# Patient Record
Sex: Female | Born: 1985 | Race: White | Hispanic: No | Marital: Single | Smoking: Current every day smoker
Health system: Southern US, Community
[De-identification: ages and names within clinical notes are randomized; demographics above are authoritative.]

## PROBLEM LIST (undated history)

## (undated) ENCOUNTER — Inpatient Hospital Stay (HOSPITAL_COMMUNITY): Payer: Self-pay

## (undated) DIAGNOSIS — R87619 Unspecified abnormal cytological findings in specimens from cervix uteri: Secondary | ICD-10-CM

## (undated) DIAGNOSIS — Z22322 Carrier or suspected carrier of Methicillin resistant Staphylococcus aureus: Secondary | ICD-10-CM

## (undated) DIAGNOSIS — G971 Other reaction to spinal and lumbar puncture: Secondary | ICD-10-CM

## (undated) DIAGNOSIS — B999 Unspecified infectious disease: Secondary | ICD-10-CM

## (undated) DIAGNOSIS — J45909 Unspecified asthma, uncomplicated: Secondary | ICD-10-CM

## (undated) DIAGNOSIS — F1910 Other psychoactive substance abuse, uncomplicated: Secondary | ICD-10-CM

## (undated) DIAGNOSIS — T8859XA Other complications of anesthesia, initial encounter: Secondary | ICD-10-CM

## (undated) DIAGNOSIS — B977 Papillomavirus as the cause of diseases classified elsewhere: Secondary | ICD-10-CM

## (undated) DIAGNOSIS — B029 Zoster without complications: Secondary | ICD-10-CM

## (undated) DIAGNOSIS — T4145XA Adverse effect of unspecified anesthetic, initial encounter: Secondary | ICD-10-CM

## (undated) DIAGNOSIS — IMO0002 Reserved for concepts with insufficient information to code with codable children: Secondary | ICD-10-CM

## (undated) DIAGNOSIS — E282 Polycystic ovarian syndrome: Secondary | ICD-10-CM

## (undated) DIAGNOSIS — F329 Major depressive disorder, single episode, unspecified: Secondary | ICD-10-CM

## (undated) DIAGNOSIS — F32A Depression, unspecified: Secondary | ICD-10-CM

## (undated) HISTORY — DX: Zoster without complications: B02.9

## (undated) HISTORY — DX: Other complications of anesthesia, initial encounter: T88.59XA

## (undated) HISTORY — PX: DILATION AND CURETTAGE OF UTERUS: SHX78

## (undated) HISTORY — DX: Adverse effect of unspecified anesthetic, initial encounter: T41.45XA

## (undated) HISTORY — DX: Papillomavirus as the cause of diseases classified elsewhere: B97.7

## (undated) HISTORY — DX: Other reaction to spinal and lumbar puncture: G97.1

## (undated) HISTORY — PX: EXTERNAL EAR SURGERY: SHX627

## (undated) HISTORY — DX: Other psychoactive substance abuse, uncomplicated: F19.10

---
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
## 2006-12-05 ENCOUNTER — Emergency Department (HOSPITAL_COMMUNITY): Admission: EM | Admit: 2006-12-05 | Discharge: 2006-12-05 | Payer: Self-pay | Admitting: Emergency Medicine

## 2007-04-17 ENCOUNTER — Inpatient Hospital Stay (HOSPITAL_COMMUNITY): Admission: AD | Admit: 2007-04-17 | Discharge: 2007-04-17 | Payer: Self-pay | Admitting: Family Medicine

## 2007-05-26 ENCOUNTER — Inpatient Hospital Stay (HOSPITAL_COMMUNITY): Admission: AD | Admit: 2007-05-26 | Discharge: 2007-05-26 | Payer: Self-pay | Admitting: Family Medicine

## 2007-06-28 ENCOUNTER — Inpatient Hospital Stay (HOSPITAL_COMMUNITY): Admission: AD | Admit: 2007-06-28 | Discharge: 2007-06-29 | Payer: Self-pay | Admitting: Gynecology

## 2007-09-20 ENCOUNTER — Ambulatory Visit (HOSPITAL_COMMUNITY): Admission: RE | Admit: 2007-09-20 | Discharge: 2007-09-20 | Payer: Self-pay | Admitting: Obstetrics & Gynecology

## 2007-11-04 ENCOUNTER — Inpatient Hospital Stay (HOSPITAL_COMMUNITY): Admission: AD | Admit: 2007-11-04 | Discharge: 2007-11-04 | Payer: Self-pay | Admitting: Family Medicine

## 2007-11-25 ENCOUNTER — Inpatient Hospital Stay (HOSPITAL_COMMUNITY): Admission: AD | Admit: 2007-11-25 | Discharge: 2007-11-26 | Payer: Self-pay | Admitting: Obstetrics & Gynecology

## 2007-12-05 ENCOUNTER — Ambulatory Visit: Payer: Self-pay | Admitting: Obstetrics & Gynecology

## 2007-12-10 ENCOUNTER — Ambulatory Visit: Payer: Self-pay | Admitting: Obstetrics & Gynecology

## 2007-12-10 ENCOUNTER — Inpatient Hospital Stay (HOSPITAL_COMMUNITY): Admission: AD | Admit: 2007-12-10 | Discharge: 2007-12-13 | Payer: Self-pay | Admitting: Gynecology

## 2007-12-11 ENCOUNTER — Encounter: Payer: Self-pay | Admitting: Obstetrics & Gynecology

## 2007-12-14 ENCOUNTER — Inpatient Hospital Stay (HOSPITAL_COMMUNITY): Admission: AD | Admit: 2007-12-14 | Discharge: 2007-12-14 | Payer: Self-pay | Admitting: Obstetrics & Gynecology

## 2008-04-12 HISTORY — PX: INCISION AND DRAINAGE OF WOUND: SHX1803

## 2008-12-15 ENCOUNTER — Emergency Department (HOSPITAL_COMMUNITY): Admission: EM | Admit: 2008-12-15 | Discharge: 2008-12-15 | Payer: Self-pay | Admitting: Emergency Medicine

## 2009-03-16 ENCOUNTER — Emergency Department (HOSPITAL_COMMUNITY): Admission: EM | Admit: 2009-03-16 | Discharge: 2009-03-16 | Payer: Self-pay | Admitting: Family Medicine

## 2009-03-18 ENCOUNTER — Emergency Department (HOSPITAL_COMMUNITY): Admission: EM | Admit: 2009-03-18 | Discharge: 2009-03-18 | Payer: Self-pay | Admitting: Family Medicine

## 2009-03-19 ENCOUNTER — Encounter (INDEPENDENT_AMBULATORY_CARE_PROVIDER_SITE_OTHER): Payer: Self-pay

## 2009-03-19 ENCOUNTER — Inpatient Hospital Stay (HOSPITAL_COMMUNITY): Admission: EM | Admit: 2009-03-19 | Discharge: 2009-03-23 | Payer: Self-pay | Admitting: Emergency Medicine

## 2010-07-14 LAB — POCT CARDIAC MARKERS
CKMB, poc: <1 ng/mL — ABNORMAL LOW (ref 1.0–8.0)
Myoglobin, poc: 62.2 ng/mL (ref 12–200)

## 2010-07-14 LAB — CBC
HCT: 34.4 % — ABNORMAL LOW (ref 36.0–46.0)
Hemoglobin: 11.6 g/dL — ABNORMAL LOW (ref 12.0–15.0)
Hemoglobin: 11.9 g/dL — ABNORMAL LOW (ref 12.0–15.0)
MCHC: 34.5 g/dL (ref 30.0–36.0)
MCHC: 35.3 g/dL (ref 30.0–36.0)
MCV: 86.2 fL (ref 78.0–100.0)
MCV: 87.2 fL (ref 78.0–100.0)
Platelets: 213 10*3/uL (ref 150–400)
RBC: 3.64 MIL/uL — ABNORMAL LOW (ref 3.87–5.11)
RBC: 3.81 MIL/uL — ABNORMAL LOW (ref 3.87–5.11)
RBC: 3.98 MIL/uL (ref 3.87–5.11)
RDW: 12.3 % (ref 11.5–15.5)
RDW: 12.5 % (ref 11.5–15.5)
RDW: 12.8 % (ref 11.5–15.5)
WBC: 6.3 10*3/uL (ref 4.0–10.5)

## 2010-07-14 LAB — BASIC METABOLIC PANEL
Calcium: 8.9 mg/dL (ref 8.4–10.5)
GFR calc Af Amer: >60 mL/min (ref >60)
GFR calc non Af Amer: >60 mL/min (ref >60)
Glucose, Bld: 99 mg/dL (ref 70–99)
Potassium: 3.4 mEq/L — ABNORMAL LOW (ref 3.5–5.1)
Sodium: 137 mEq/L (ref 135–145)

## 2010-07-14 LAB — CULTURE, ROUTINE-ABSCESS

## 2010-07-14 LAB — POCT I-STAT, CHEM 8
Glucose, Bld: 112 mg/dL — ABNORMAL HIGH (ref 70–99)
HCT: 40 % (ref 36.0–46.0)
Hemoglobin: 13.6 g/dL (ref 12.0–15.0)
Potassium: 3.4 mEq/L — ABNORMAL LOW (ref 3.5–5.1)
Sodium: 135 mEq/L (ref 135–145)
TCO2: 23 mmol/L (ref 0–100)

## 2010-07-14 LAB — ANAEROBIC CULTURE

## 2010-07-14 LAB — DIFFERENTIAL
Basophils Absolute: 0 10*3/uL (ref 0.0–0.1)
Basophils Relative: 1 % (ref 0–1)
Eosinophils Absolute: 0.2 10*3/uL (ref 0.0–0.7)
Monocytes Relative: 7 % (ref 3–12)
Neutro Abs: 5.2 10*3/uL (ref 1.7–7.7)
Neutrophils Relative %: 63 % (ref 43–77)

## 2010-08-25 NOTE — Op Note (Signed)
[name], Campbell               ACCOUNT NO.:  [id number]   MEDICAL RECORD NO.:  [id number]          PATIENT TYPE:  INP   LOCATION:  9102                          FACILITY:  [facility]   PHYSICIAN:  Norton Blizzard, MD    DATE OF BIRTH:  06-24-1985   DATE OF PROCEDURE:  12/11/2007  DATE OF DISCHARGE:                               OPERATIVE REPORT   PREOPERATIVE DIAGNOSES:  1. Term intrauterine pregnancy.  2. Nonreassuring fetal heart tracing remote from delivery.   POSTOPERATIVE DIAGNOSES:  1. Term intrauterine pregnancy.  2. Nonreassuring fetal heart tracing remote from delivery.   PROCEDURE:  Primary low transverse cesarean section.   SURGEON:  Norton Blizzard, MD   ASSISTANT:  Odie Sera, DO.   ANESTHESIA:  Epidural and local.   INDICATIONS FOR PROCEDURE:  [name] is a 25 year old gravida  2, para 0-0-1-0 who was admitted for induction of labor at 41-3/[redacted] weeks  gestational age.  She was induced with Cytotec and Pitocin; however,  upon increasing the dose of Pitocin, the baby had repetitive severe  variable decelerations.  Because of the nonreassuring fetal heart  tracing remote from delivery, the decision was made to proceed with  cesarean section.  The patient was counseled on the benefits and risks  of cesarean section, and agreed to proceed with surgery.   FINDINGS:  1. Clear amniotic fluid.  2. Viable female infant.  3. Grossly normal uterus, fallopian tubes, and ovaries.   SPECIMENS:  Placenta.   DISPOSITION:  To labor and delivery.   IV FLUIDS:  In 800 mL.   ESTIMATED BLOOD LOSS:  700 mL.   URINE OUTPUT:  100 mL.   COMPLICATIONS:  None immediate.   PROCEDURE DETAILS:  The patient was taken to the operating room and a  time-out was conducted.  The patient was prepped and draped in the usual  sterile manner, and placed in the left dorsal supine position.  Appropriate anesthesia with epidural was confirmed.  The area of  incision was infiltrated  with 20 mL of 1% lidocaine.  A transverse  Pfannenstiel incision was made through the skin with a scalpel, and  continued down until the fascia was incised in the midline.  The fascia  was bluntly dissected off the underlying rectus muscles in the usual  manner.  The fascial incision was extended bilaterally using scissors.  The rectal muscles were spread and the peritoneum was entered bluntly.  A bladder blade was placed and a transverse incision was made in the  lower uterine segment with a scalpel.  The last layers of the myometrium  were penetrated bluntly with the finger.  The uterine incision was  extended bluntly.  Clear amniotic fluid was noted.  The head was grasped  and elevated to the pelvis.  Fundal pressure was applied and the head  delivered easily.  The mouth was bulb suctioned and there was a  spontaneous cry.  The position of the baby was occiput posterior.  The  shoulders and rest of the corpus were delivery without complications.  The cord was clamped and cut.  There were no gross abnormalities of the  infant.  The infant was handed to the NICU team with a spontaneous cry.  The placenta was delivered manually, intact with 3-vessel cord.  The  uterus was exteriorized, and grossly normal tubes and ovaries were  noted.  The uterus was cleared of debris and clots with a dry lap.  The  uterine incision was closed with 0 Monocryl in a running interlocking  fashion.  Hemostasis was confirmed.  The uterus was returned to the  abdomen and hemostasis once again was confirmed.  The rectus muscles  were loosely approximated using 0 Vicryl.  The fascia was closed in the  usual manner with 0 Vicryl.  No fascial defects were noted.  Hemostasis  was also noted.  Because the subcutaneous fat had a depth of over 1  inch, the subcutaneous fat and Scarpa fascia were also approximated  using 2-0 plain gut.  The skin was closed with staples.  The patient  tolerated the procedure well with no  complications, and she was sent to  the PACU in good condition.      Odie Sera, DO  Electronically Signed     ______________________________  Norton Blizzard, MD    MC/MEDQ  D:  12/11/2007  T:  12/12/2007  Job:  [id number]
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
## 2010-08-28 NOTE — Op Note (Signed)
[facility]  Patient:    Taylor Campbell, Taylor Campbell Visit Number: [id number] MRN: [id number]          Service Type: DSU Location: [facility] Attending Physician:  Fernande Boyden Proc. Date: 02/13/01 Admit Date:  02/13/2001                             Operative Report  PREOPERATIVE DIAGNOSIS:  Chronic suppurative otitis media/cholesteotoma, left.  POSTOPERATIVE DIAGNOSIS:  Chronic suppurative otitis media/cholesteotoma, left.  PROCEDURE:  Left tympanoplasty with osiculoplasty, left T-tube placement.  SURGEON:  Gloris Manchester. Lazarus Salines, M.D.  ANESTHESIA:  General orotracheal.  ESTIMATED BLOOD LOSS:  Minimal.  COMPLICATIONS:  None.  FINDINGS:  An indwelling anterior Paparella type tube.  Moderate crusting in the medial canal on the left side.  A significant posterior superior pars tensa left tympanic retraction up into the facial recess area.  On further exploration, including erosion of the long process of the incus and erosion of the stapes suprastructure with cholesteotoma back into the facial recess.  No cholesteotoma in the attic.  Mobile maleus and incus.  Somewhat foreshortened long process of the maleus, but no attachment to the promentory.  A very prominent jugular bulb in the posterior inferior mesotynpanum.  No signs of active infection.  DESCRIPTION OF PROCEDURE:  With the patient in a comfortable supine position, general orotracheal anesthesia was induced without difficulty.  At an appropriate level, the table was turned 90 degrees away from anesthesia and the patient placed in a slight reversed Trendelenburg.  The head was rotated toward the right for access to the left ear region.  A minimal hair shave was performed.  1% Xylocaine with 1:100,000 epinephrine, 10 cc total was infiltrated into the postauricular incision site into the vascular strip from the postauricular approach, and into the temporalis fascia harvest site. A sterile  preparation and draping of the left ear was accomplished.  Microscope and speculum were brought into the field and the ear canal was inspected and cleaned with the findings as described above.  The four quadrant injections of 2% Xylocaine with 1:50,000 epinephrine, 2 cc total were infiltrated in the standard fashion.  Several minutes were allowed for these to take effect.  A postauricular incision was made in the standard fashion using a Shaw scalpel and carried down through the skin, subcutaneous tissues, and down to the periosteum of the mastoid and the area overlying the temporalis fascia.  The lateral surface of the temporalis fascia was cleared.  An inferior incision was made and the medial surface was cleared and a 1.5 x 2 piece of temporalis fascia was harvested, cleaned, pressed, and placed to dry.  Hemostasis was spontaneous.  The periosteum along the linea temporalis and down along the mastoid cortex was incised and elevated forward to a short degree in preparation for a postauricular approach.  Attention was returned to the ear canal.  The ear canal was further cleaned with the findings as described above.  6 and 12 oclock incisions were made from the annulus outward into the cartilaginous canal and connected not quite at the bony cartilaginous junction on the posterior aspect of the canal with erosion round knife.  The medial flap was elevated to some distance down toward the annulus, but this elevation was not completed.  Attention was returned to the postauricular region and using the [facility] elevator, the periosteum was elevated forward toward the ear canal.  Upon reaching the  canal proper, a sharp Freer elevator was used to further elevate the canal down into the bony canal.  The incisions were identified and the vascular strip was retracted forward.  Weidlaner and Perkins retractors were used to widen the entire field.  From this vantage point, good access to the  posterior middle ear was accomplished.  The flap was elevated downward and the middle ear space was entered inferiorly.  The prominent jugular bulb as identified above was identified and the middle ear space was lateral to this and was containing a small amount of thin fluid, but no mucosal adhesions in this vicinity.  The vascular strip was incised away from the retraction pocket and the retraction pocket was incised in all its circumference from the tympanic membrane.  This flap was laid forward.  Working around all aspects of the retraction pocket, it was elevated posteriorly from the anterior aspect and underneath the annulus in the posterior aspect.  A large cutting bur was used to lower the spine of Hendley and enlarge the external meatus slightly.  A smaller cutting bur was used to perform an atticotomy in stages and the final thin membrane was cleared with a curet.  The cortotympany nerve had been identified, but was sacrificed in the process of trying to widen the atticotomy to the point to allow complete access around the retraction pocket.  Working through this retraction pocket, the sac was gently elevated from the facial recess using the Rosen round knife and the sinus tympany excavators.  The long process of the incus was identified and the material was dissected free.  The remainder of the cortotympany nerve was identified and retracted free from the retraction pocket.  Working more deeply, the stapes capisculum was identified along with the trapezius tendon, but it was free floating and does appear to be any stapes crure.  The dissection was carefully brought back from the promentory up from the inferior facial recess and finally remained pedicle in the region of the stapes footplate.  The facial nerve was noted to be bone covered in the entire surgical field.  The dissection was carried down carefully removing all material from the facial recess and finally the sac  was elevated upward from the stapes footplate which was identified and noted to be mobile.  No evidence of suprastructure was identified nor was there any  evidence of leaking.  The sac was observed to be intact and sufficient integrity.  The dissection was easily carried out to bring the entire sac forward and deliver it without violation.  The sac was sent for a specimen.  At this point, the incus was observed to be far away from any connection to the footplate and it was disarticulated and saved for later use.  The tensor tympany tendon was lysed to allow the long process of the maleus to lateralize slightly and come a little bit more posterior.  The Paparella tube was removed and replaced with a modified T-tube without difficulty.  A small crescent of 0.020 reinforced Silastic was fashioned and placed up into the eustachian tube and back into the posterior inferior mesotympanum to prevent adhesions over the jugular bulb following reconstruction.  Upon removing the incus, the attic was explored blindly and no evidence of cholesteotoma was noted.  The incus was fashioned with the long process going down toward the footplate and a notch was made for the maleus handle.  This was situated and observed to be of adequate depth and stability.  It was  supported in the oval window niche with cortisporin-impregnated Gelfoam.  At this point, the previously harvested fascia was trimmed to size.  It was placed beneath the tympanic membrane remnant and situated in between the incus inner position and the maleus long handle.  It was draped up the posterior canal wall.  The vascular strip was laid across the graft and situated so the annulus could heal back into is normal position.  Cortisporin-impregnated Gelfoam pledgets were placed across the entire surface of the tympanic membrane and up the canal to support the graft.  Attention was returned to the postauricular wound.  The periosteum  was reapproximated with 3-0 chromic as was the skin and subcutaneous tissues. Benzoin was applied and Steri-Strips applied to close the postauricular wound.  Returning to the canal, the vascular strip was laid back down across the temporalis fascia and additional Gelfoam packing was used to support this. Finally a cottonball was wedged into the external meatus and another cottonball placed into the conchal bowl.  This completed the surgery.  A standard fluff and cling mastoid dressing was applied.  The patient was returned to anesthesia, awakened, extubated, and transferred to the recovery room in stable condition.  COMMENTS:  A 25 year old white female with a several-year history of problems with her ear failing to respond to prolonged ventilation was the indication for todays procedure.  Anticipated routine postoperative recovery with attention to analgesics, antibiosis, water precautions.  Will remove the mastoid dressing in two days, will remove the ear canal cottonballs in one week and begin cortisporin drops to dissolve the Gelfoam.  Given low anticipated risks of postanesthetic and postsurgical complications, I feel an outpatient venu is appropriate. Attending Physician:  Fernande Boyden DD:  02/13/01 TD:  02/14/01 Job: [id number]

## 2010-08-28 NOTE — Discharge Summary (Signed)
NAMEJUDAH, Taylor Campbell               ACCOUNT NO.:  [id number]   MEDICAL RECORD NO.:  [id number]          PATIENT TYPE:  INP   LOCATION:  9102                          FACILITY:  [facility]   PHYSICIAN:  Ginger Carne, MD  DATE OF BIRTH:  10/07/1985   DATE OF ADMISSION:  12/10/2007  DATE OF DISCHARGE:  12/13/2007                               DISCHARGE SUMMARY   REASON FOR ADMISSION:  Induction of labor.   PROCEDURES:  1. Ultrasound.  2. Nonstress test.   PROCEDURES INTRAPARTUM:  Cesarean section, low transverse.   PROCEDURE POSTPARTUM:  None.   COMPLICATIONS:  None.   DISCHARGE DIAGNOSES:  1. Term pregnancy delivered.  2. Failed induction.   This is a 25 year old G2, P 0-0-1-0 female presenting for induction of  labor at 41.4 weeks' gestational age due to nonreassuring fetal heart  tracing.  Low transverse C-section was performed due to late  decelerations.  A viable female infant was delivered in the OR with  Apgars 9 and 9.  The patient had no complications following surgery.  The patient was discharged to home on postoperative day 2 in stable  condition.  Staple removal on postoperative day 5-7.   PERTINENT LABORATORY DATA:  Blood type O positive, antibody negative,  GBS negative, and rubella immune.   Hemoglobin 10.8 on postoperative day 1.   DISCHARGE MEDICATIONS:  The patient's discharge medications are as  follows:  1. Percocet 1-2 tablets p.o. q.4-6 h. p.r.n. pain.  2. Ibuprofen 600 mg p.o. q.6 h. p.r.n. pain.  3. Colace 100 mg b.i.d. p.r.n. constipation.   The patient will be using Depo-Provera, outpatient setting for birth  control.  The patient is bottle feeding.   The patient was discharged to home in stable condition.   INSTRUCTIONS:  1. Pelvic rest x6 weeks.  2. Follow up at [facility] in 6 weeks.  3. Baby Love nurse to home on postoperative day 5-7 to remove staples.      Bobby Rumpf, MD  Electronically Signed     ______________________________  Ginger Carne, MD    KC/MEDQ  D:  01/19/2008  T:  01/20/2008  Job:  [id number]
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
## 2010-12-06 IMAGING — CR DG ANKLE COMPLETE 3+V*L*
3 series · 3 of 3 positions shown · non-contrast
Comparison: None

CLINICAL DATA: Left ankle injury.

LEFT ANKLE COMPLETE - 3+ VIEW

[view not recorded (1 of 3)]
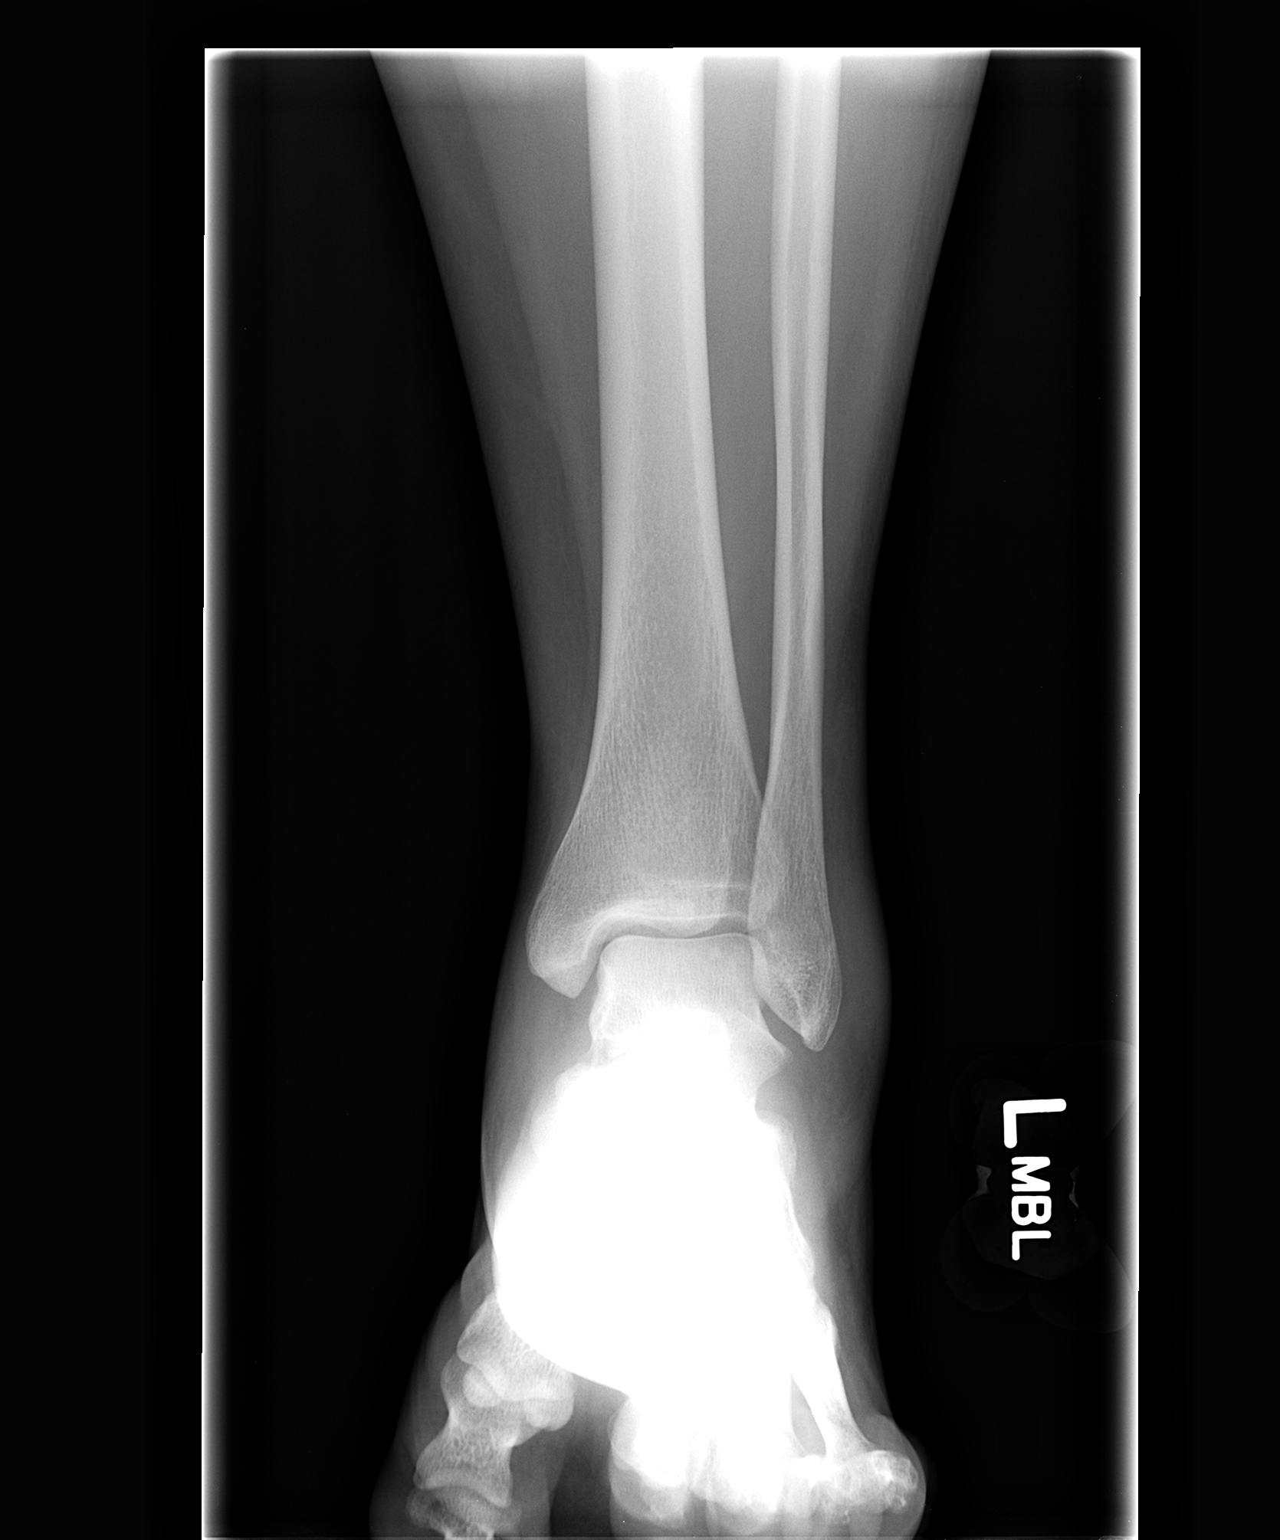

[view not recorded (2 of 3)]
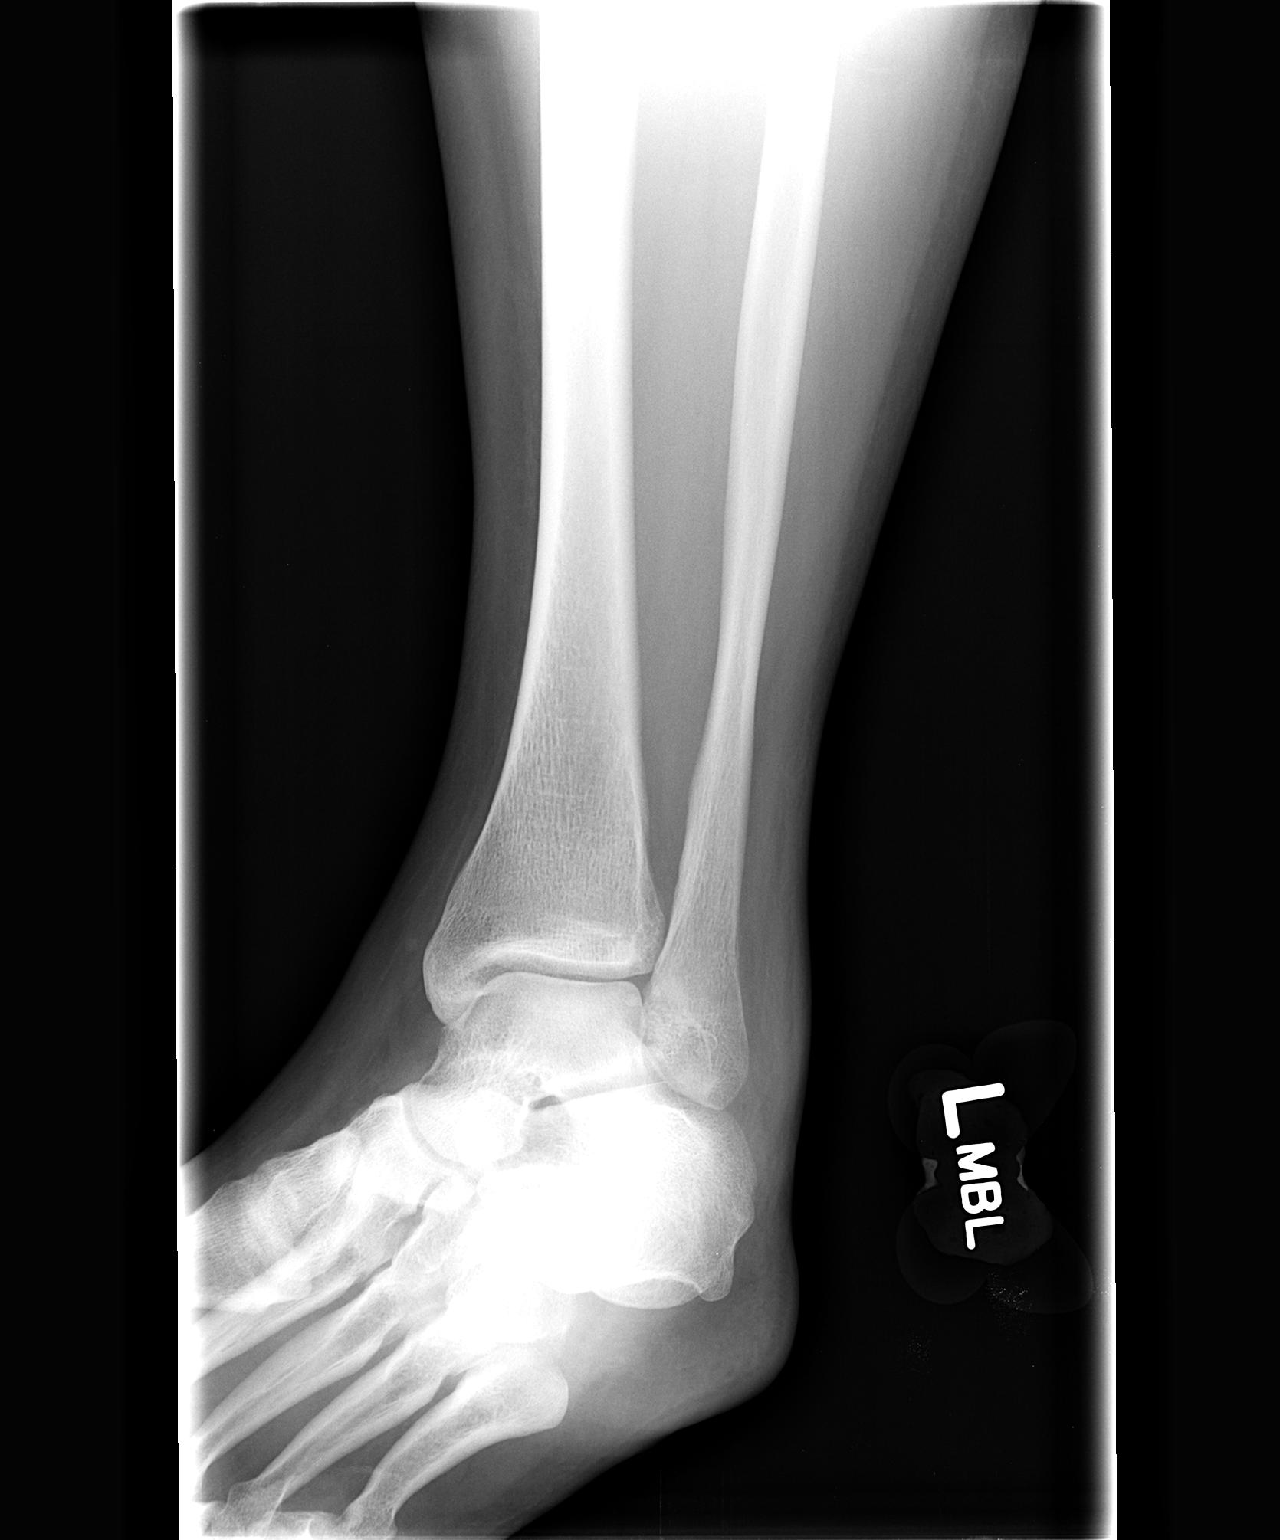

[view not recorded (3 of 3)]
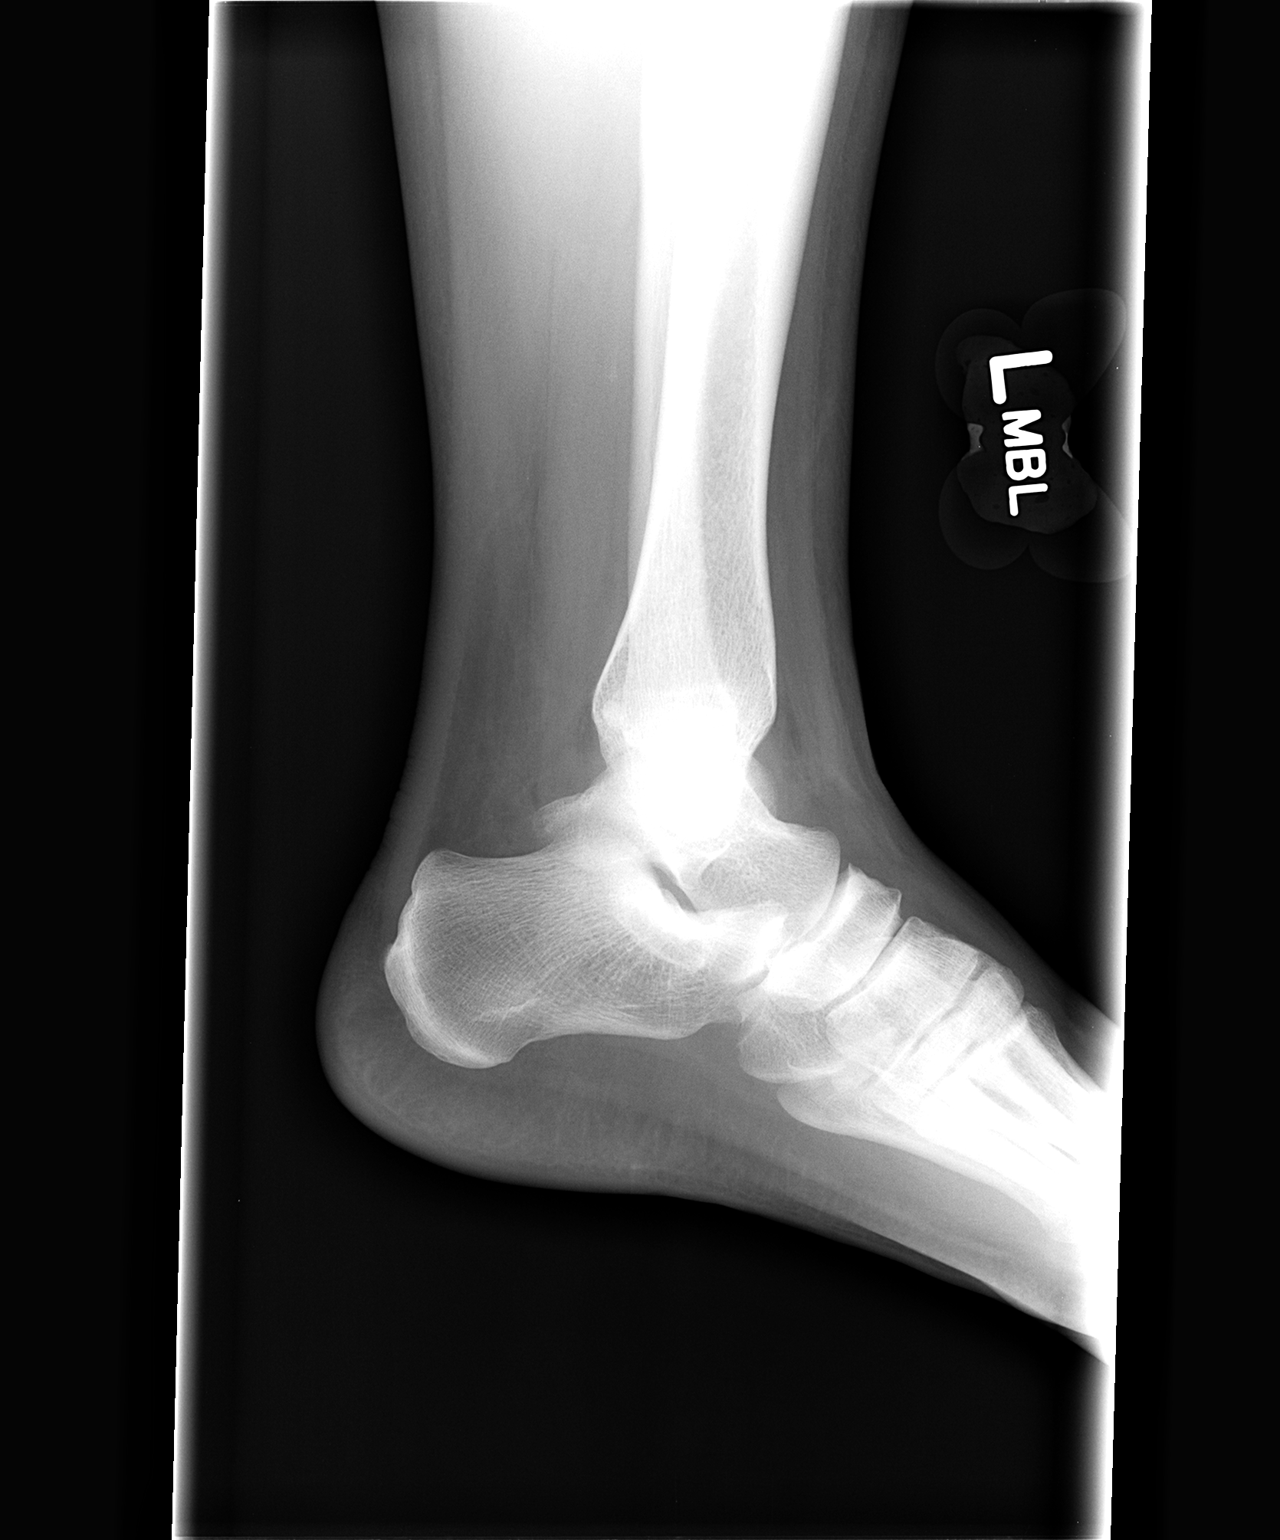

[3 of 3 positions shown; findings below may reference images not displayed]

FINDINGS: The ankle mortise is maintained.  No fractures are seen.
No osteochondral abnormalities.  There is marked soft tissue
swelling.  No definite joint effusion.
IMPRESSION: No acute bony findings.

## 2010-12-31 LAB — CBC
HCT: 41.2
MCHC: 34.8
MCV: 90.2
RBC: 4.57
WBC: 9.6

## 2010-12-31 LAB — WET PREP, GENITAL
Clue Cells Wet Prep HPF POC: NONE SEEN
Yeast Wet Prep HPF POC: NONE SEEN

## 2010-12-31 LAB — URINALYSIS, ROUTINE W REFLEX MICROSCOPIC
Glucose, UA: NEGATIVE
Ketones, ur: NEGATIVE
Nitrite: NEGATIVE
pH: 8

## 2010-12-31 LAB — POCT PREGNANCY, URINE: Operator id: 239321

## 2011-01-01 LAB — GC/CHLAMYDIA PROBE AMP, GENITAL: Chlamydia, DNA Probe: NEGATIVE

## 2011-01-01 LAB — WET PREP, GENITAL
Clue Cells Wet Prep HPF POC: NONE SEEN
WBC, Wet Prep HPF POC: NONE SEEN

## 2011-01-04 LAB — CBC
Platelets: 142 — ABNORMAL LOW
RDW: 13.1
WBC: 8.4

## 2011-01-04 LAB — URINALYSIS, ROUTINE W REFLEX MICROSCOPIC
Ketones, ur: NEGATIVE
Nitrite: NEGATIVE
Protein, ur: NEGATIVE
Urobilinogen, UA: 0.2

## 2011-01-04 LAB — DIFFERENTIAL
Basophils Relative: 0
Eosinophils Absolute: 0.1
Eosinophils Relative: 2
Monocytes Relative: 10
Neutrophils Relative %: 66

## 2011-01-04 LAB — GC/CHLAMYDIA PROBE AMP, GENITAL: Chlamydia, DNA Probe: NEGATIVE

## 2011-01-04 LAB — WET PREP, GENITAL

## 2011-01-13 LAB — CBC
HCT: 30.9 — ABNORMAL LOW
Hemoglobin: 10.8 — ABNORMAL LOW
RBC: 3.31 — ABNORMAL LOW
RDW: 12.8
WBC: 10

## 2012-01-11 ENCOUNTER — Encounter (HOSPITAL_COMMUNITY): Payer: Self-pay | Admitting: Emergency Medicine

## 2012-01-11 ENCOUNTER — Emergency Department (INDEPENDENT_AMBULATORY_CARE_PROVIDER_SITE_OTHER)
Admission: EM | Admit: 2012-01-11 | Discharge: 2012-01-11 | Disposition: A | Discharge status: Home / Self Care | Payer: Self-pay | Source: Home / Self Care | Attending: Emergency Medicine | Admitting: Emergency Medicine

## 2012-01-11 DIAGNOSIS — M25569 Pain in unspecified knee: Secondary | ICD-10-CM

## 2012-01-11 DIAGNOSIS — M25561 Pain in right knee: Secondary | ICD-10-CM

## 2012-01-11 DIAGNOSIS — L299 Pruritus, unspecified: Secondary | ICD-10-CM

## 2012-01-11 MED ORDER — MELOXICAM 7.5 MG PO TABS: 7.5000 mg | ORAL_TABLET | Freq: Every day | ORAL | Status: DC

## 2012-01-11 MED ORDER — PERMETHRIN 5 % EX CREA: TOPICAL_CREAM | CUTANEOUS | Status: DC

## 2012-01-11 NOTE — ED Notes (Signed)
Itching for 4 weeks.  Generalized itching.  Areas that itch are on trunk and extremities.  Initially started on trunk, under breasts.  Patient reports moving to a different home during the past 4 weeks.  Patient also c/o knee pain.  Reports intermittent swelling and pain in right knee.  Knee issues for 2 years.

## 2012-01-11 NOTE — ED Provider Notes (Signed)
History     CSN: [id number]  Arrival date & time 01/11/12  1503   None     Chief Complaint  Patient presents with  . Pruritis    (Consider location/radiation/quality/duration/timing/severity/associated sxs/prior treatment) The history is provided by the patient.  This patient complains of a pruritic rash.  Location: bilateral forearms, breast, bilateral groin, hands  Onset: 4 wk ago   Course: unchanged Self-treated with: benadryl           Improvement with treatment: no  History Itching: yes  Tenderness: no  New medications/antibiotics: no  Pet exposure: no  Recent travel or tropical exposure: no  New soaps, shampoos, detergent, clothing: no Tick/insect exposure: reports bed bugs one month ago  Red Flags Feeling ill: no Fever:no Facial/tongue swelling/difficulty breathing:  no  Diabetic or immunocompromised: no   Additionally complains of right knee pain, recurrent for the past 2 months.  No known injury.  Symptoms have been intermittent, noticed to be worse with exercise, or prolonged walking.  Denies prior history of related problems in the past.  Has not taken medication at home for pain.  History reviewed. No pertinent past medical history.  History reviewed. No pertinent past surgical history.  No family history on file.  History  Substance Use Topics  . Smoking status: Current Every Day Smoker  . Smokeless tobacco: Not on file  . Alcohol Use: Yes    OB History    Grav Para Term Preterm Abortions TAB SAB Ect Mult Living                  Review of Systems  Constitutional: Negative.   Respiratory: Negative.   Cardiovascular: Negative.   Musculoskeletal: Positive for joint swelling and arthralgias. Negative for myalgias, back pain and gait problem.  Skin: Positive for rash. Negative for color change, pallor and wound.    Allergies  Review of patient's allergies indicates no known allergies.  Home Medications   Current Outpatient Rx  Name  Route Sig Dispense Refill  . DIPHENHYDRAMINE HCL 25 MG PO TABS Oral Take 25 mg by mouth every 6 (six) hours as needed.    . MELOXICAM 7.5 MG PO TABS Oral Take 1 tablet (7.5 mg total) by mouth daily. 30 tablet 1  . PERMETHRIN 5 % EX CREA  Apply to affected area once 60 g 0    BP 124/88  Pulse 88  Temp 98.4 F (36.9 C) (Oral)  Resp 18  SpO2 99%  LMP 12/12/2011  Physical Exam  Nursing note and vitals reviewed. Constitutional: She is oriented to person, place, and time. Vital signs are normal. She appears well-developed and well-nourished. She is active and cooperative.  HENT:  Head: Normocephalic.  Mouth/Throat: Uvula is midline, oropharynx is clear and moist and mucous membranes are normal.  Eyes: Conjunctivae normal are normal. Pupils are equal, round, and reactive to light. No scleral icterus.  Neck: Trachea normal, normal range of motion and full passive range of motion without pain. Neck supple. No spinous process tenderness and no muscular tenderness present. No thyromegaly present.  Cardiovascular: Normal rate, regular rhythm, normal heart sounds, intact distal pulses and normal pulses.   Pulmonary/Chest: Effort normal and breath sounds normal.  Neurological: She is alert and oriented to person, place, and time. No cranial nerve deficit or sensory deficit.  Skin: Skin is warm and dry. Rash noted. Rash is papular.       Diffuse fine papular rash, concentrated  Psychiatric: She has a normal mood  and affect. Her speech is normal and behavior is normal. Judgment and thought content normal. Cognition and memory are normal.    ED Course  Procedures (including critical care time)  Labs Reviewed - No data to display No results found.   1. Generalized pruritus   2. Right knee pain       MDM  Cool showers; avoid heat, sunlight and anything that makes condition worse. Zyrtec for at least seven days. Elimite-follow directions.  RTC if symptoms do not improve or begin to have  problems swallowing, breathing or significant change in condition.        Johnsie Kindred, NP 01/11/12 1706

## 2012-01-12 NOTE — ED Provider Notes (Signed)
Medical screening examination/treatment/procedure(s) were performed by non-physician practitioner and as supervising physician I was immediately available for consultation/collaboration.  Leslee Home, M.D.   Reuben Likes, MD 01/12/12 [phone]

## 2012-04-27 ENCOUNTER — Encounter (HOSPITAL_COMMUNITY): Payer: Self-pay | Admitting: *Deleted

## 2012-04-27 ENCOUNTER — Inpatient Hospital Stay (HOSPITAL_COMMUNITY)
Admission: AD | Admit: 2012-04-27 | Discharge: 2012-04-27 | Disposition: A | Discharge status: Home / Self Care | Payer: Self-pay | Source: Ambulatory Visit | Attending: Obstetrics and Gynecology | Admitting: Obstetrics and Gynecology

## 2012-04-27 DIAGNOSIS — R1032 Left lower quadrant pain: Secondary | ICD-10-CM

## 2012-04-27 DIAGNOSIS — N949 Unspecified condition associated with female genital organs and menstrual cycle: Secondary | ICD-10-CM

## 2012-04-27 DIAGNOSIS — Z3202 Encounter for pregnancy test, result negative: Secondary | ICD-10-CM

## 2012-04-27 HISTORY — DX: Unspecified asthma, uncomplicated: J45.909

## 2012-04-27 HISTORY — DX: Carrier or suspected carrier of methicillin resistant Staphylococcus aureus: Z22.322

## 2012-04-27 LAB — HCG, QUANTITATIVE, PREGNANCY: hCG, Beta Chain, Quant, S: <1 m[IU]/mL (ref <5)

## 2012-04-27 LAB — URINALYSIS, ROUTINE W REFLEX MICROSCOPIC
Bilirubin Urine: NEGATIVE
Glucose, UA: NEGATIVE mg/dL
Hgb urine dipstick: NEGATIVE
Specific Gravity, Urine: 1.015 (ref 1.005–1.030)
pH: 8 (ref 5.0–8.0)

## 2012-04-27 LAB — POCT PREGNANCY, URINE: Preg Test, Ur: NEGATIVE

## 2012-04-27 NOTE — MAU Provider Note (Signed)
History     CSN: [id number]  Arrival date and time: 04/27/12 1518   First Provider Initiated Contact with Patient 04/27/12 1611      Chief Complaint  Patient presents with  . Pelvic Pain   HPI 27 y.o. G2P1011 presents with 2 weeks of LLQ and hypogastric pain described as a pressure feeling, and 2 weeks of morning nausea with 3 days of emesis. PT has a history of a miscarriage with a D&C at the age of 76.  LMP started on Dec 13th. HPT was done last Sunday and Tuesday, both with positive results.  Pt is sexually active. Denies dysuria, vaginal bleeding, cramping, constipation or change in bowel habits.  Pt does endorse urinary frequency with a white vaginal discharge. OB History    Grav Para Term Preterm Abortions TAB SAB Ect Mult Living   2 1 1  1  1   1       Past Medical History  Diagnosis Date  . Asthma   . MRSA (methicillin resistant staph aureus) culture positive     Past Surgical History  Procedure Date  . Cesarean section   . External ear surgery     History reviewed. No pertinent family history.  History  Substance Use Topics  . Smoking status: Current Every Day Smoker -- 0.5 packs/day    Types: Cigarettes  . Smokeless tobacco: Not on file  . Alcohol Use: No    Allergies: No Known Allergies  Prescriptions prior to admission  Medication Sig Dispense Refill  . [DISCONTINUED] diphenhydrAMINE (BENADRYL) 25 MG tablet Take 25 mg by mouth every 6 (six) hours as needed.      . [DISCONTINUED] meloxicam (MOBIC) 7.5 MG tablet Take 1 tablet (7.5 mg total) by mouth daily.  30 tablet  1  . [DISCONTINUED] permethrin (ELIMITE) 5 % cream Apply to affected area once  60 g  0    Review of Systems  Constitutional: Negative for fever, chills and malaise/fatigue.  Gastrointestinal: Positive for nausea, vomiting and abdominal pain. Negative for heartburn, diarrhea, constipation and blood in stool.  Genitourinary: Positive for frequency. Negative for dysuria, urgency and flank  pain.  Skin: Negative for itching and rash.  Neurological: Negative for dizziness, weakness and headaches.   Physical Exam   Blood pressure 119/67, pulse 102, temperature 97.6 F (36.4 C), temperature source Oral, resp. rate 16, last menstrual period 03/24/2012.  Physical Exam  Constitutional: She is oriented to person, place, and time. She appears well-developed and well-nourished. No distress.  Eyes: Conjunctivae normal and EOM are normal. Pupils are equal, round, and reactive to light.  Neck: Normal range of motion. Neck supple.  Cardiovascular: Normal rate, regular rhythm, normal heart sounds and intact distal pulses.   Respiratory: Effort normal and breath sounds normal.  GI: Soft. Bowel sounds are normal. She exhibits no distension. There is tenderness.  Neurological: She is alert and oriented to person, place, and time.  Skin: Skin is warm and dry.  Psychiatric: She has a normal mood and affect.    MAU Course  Procedures: NA Assumed care of pt from [facility], PA-S at 1700. Pt rates LLQ pain 2/10 w/out pain meds, but states that at times it worsen to 5 or 6/10. No N/V now. Concerned because this is not normal for her. Will order outpt pelvic US.  Quant <1. Pt very relieved.  Results for orders placed during the hospital encounter of 04/27/12 (from the past 48 hour(s))  URINALYSIS, ROUTINE W REFLEX MICROSCOPIC  Status: Abnormal   Collection Time   04/27/12  3:25 PM      Component Value Range Comment   Color, Urine YELLOW  YELLOW    APPearance CLOUDY (*) CLEAR    Specific Gravity, Urine 1.015  1.005 - 1.030    pH 8.0  5.0 - 8.0    Glucose, UA NEGATIVE  NEGATIVE mg/dL    Hgb urine dipstick NEGATIVE  NEGATIVE    Bilirubin Urine NEGATIVE  NEGATIVE    Ketones, ur NEGATIVE  NEGATIVE mg/dL    Protein, ur NEGATIVE  NEGATIVE mg/dL    Urobilinogen, UA 1.0  0.0 - 1.0 mg/dL    Nitrite NEGATIVE  NEGATIVE    Leukocytes, UA NEGATIVE  NEGATIVE MICROSCOPIC NOT DONE ON URINES  WITH NEGATIVE PROTEIN, BLOOD, LEUKOCYTES, NITRITE, OR GLUCOSE <1000 mg/dL.  POCT PREGNANCY, URINE     Status: Normal   Collection Time   04/27/12  3:42 PM      Component Value Range Comment   Preg Test, Ur NEGATIVE  NEGATIVE   HCG, QUANTITATIVE, PREGNANCY     Status: Normal   Collection Time   04/27/12  4:28 PM      Component Value Range Comment   hCG, Beta Chain, Quant, S <1  <5 mIU/mL    ASSESSMENT: 1. LLQ abdominal pain   2.  Pregnancy test negative  PLAN: D/C home. OP Pelvic US ordered. Follow-up Information    Follow up with [facility]. In 3 weeks.   Contact information:   [address] [place] [address] [phone]      Follow up with [facility]. (As needed if symptoms worsen)    Contact information:   [address] [id number] mc [place] [address] [phone]           Medication List     As of 04/28/2012  8:52 AM    STOP taking these medications         diphenhydrAMINE 25 MG tablet   Commonly known as: BENADRYL      meloxicam 7.5 MG tablet   Commonly known as: MOBIC      permethrin 5 % cream   Commonly known as: Gwen Her, Indy Prestwood 04/27/2012, 6:53 PM

## 2012-04-27 NOTE — MAU Note (Signed)
Positive HPT on Sunday, pelvic pressure for 2 weeks, also pelvic pain @ times.  Denies bleeding.

## 2012-04-30 LAB — GC/CHLAMYDIA PROBE AMP
CT Probe RNA: POSITIVE — AB
GC Probe RNA: NEGATIVE

## 2012-05-01 NOTE — MAU Provider Note (Signed)
Attestation of Attending Supervision of Advanced Practitioner (CNM/NP): Evaluation and management procedures were performed by the Advanced Practitioner under my supervision and collaboration.  I have reviewed the Advanced Practitioner's note and chart, and I agree with the management and plan.  Kalianna Verbeke 05/01/2012 3:02 PM

## 2012-05-03 ENCOUNTER — Ambulatory Visit (HOSPITAL_COMMUNITY): Payer: Self-pay | Attending: Advanced Practice Midwife

## 2012-06-29 ENCOUNTER — Encounter (HOSPITAL_COMMUNITY): Payer: Self-pay

## 2012-06-29 ENCOUNTER — Emergency Department (HOSPITAL_COMMUNITY)
Admission: EM | Admit: 2012-06-29 | Discharge: 2012-06-29 | Disposition: A | Discharge status: Home / Self Care | Payer: Self-pay | Attending: Emergency Medicine | Admitting: Emergency Medicine

## 2012-06-29 DIAGNOSIS — R112 Nausea with vomiting, unspecified: Secondary | ICD-10-CM | POA: Insufficient documentation

## 2012-06-29 DIAGNOSIS — R51 Headache: Secondary | ICD-10-CM | POA: Insufficient documentation

## 2012-06-29 DIAGNOSIS — J45909 Unspecified asthma, uncomplicated: Secondary | ICD-10-CM | POA: Insufficient documentation

## 2012-06-29 DIAGNOSIS — Z8614 Personal history of Methicillin resistant Staphylococcus aureus infection: Secondary | ICD-10-CM | POA: Insufficient documentation

## 2012-06-29 DIAGNOSIS — H53149 Visual discomfort, unspecified: Secondary | ICD-10-CM | POA: Insufficient documentation

## 2012-06-29 DIAGNOSIS — Z3202 Encounter for pregnancy test, result negative: Secondary | ICD-10-CM | POA: Insufficient documentation

## 2012-06-29 DIAGNOSIS — F17200 Nicotine dependence, unspecified, uncomplicated: Secondary | ICD-10-CM | POA: Insufficient documentation

## 2012-06-29 LAB — POCT PREGNANCY, URINE: Preg Test, Ur: NEGATIVE

## 2012-06-29 MED ORDER — METOCLOPRAMIDE HCL 10 MG PO TABS: 10.0000 mg | ORAL_TABLET | Freq: Four times a day (QID) | ORAL | Status: DC | PRN

## 2012-06-29 MED ORDER — DEXAMETHASONE SODIUM PHOSPHATE 10 MG/ML IJ SOLN
10.0000 mg | Freq: Once | INTRAMUSCULAR | Status: AC
  Administered 2012-06-29: 10 mg via INTRAVENOUS
  Filled 2012-06-29: qty 1

## 2012-06-29 MED ORDER — DIPHENHYDRAMINE HCL 50 MG/ML IJ SOLN
25.0000 mg | Freq: Once | INTRAMUSCULAR | Status: AC
  Administered 2012-06-29: 25 mg via INTRAVENOUS
  Filled 2012-06-29: qty 1

## 2012-06-29 MED ORDER — METOCLOPRAMIDE HCL 5 MG/ML IJ SOLN
10.0000 mg | Freq: Once | INTRAMUSCULAR | Status: AC
  Administered 2012-06-29: 10 mg via INTRAVENOUS
  Filled 2012-06-29: qty 2

## 2012-06-29 MED ORDER — SODIUM CHLORIDE 0.9 % IV BOLUS (SEPSIS)
1000.0000 mL | Freq: Once | INTRAVENOUS | Status: AC
  Administered 2012-06-29: 1000 mL via INTRAVENOUS

## 2012-06-29 NOTE — ED Provider Notes (Signed)
History     CSN: [id number]  Arrival date & time 06/29/12  4098   First MD Initiated Contact with Patient 06/29/12 [phone]      Chief Complaint  Patient presents with  . Headache    (Consider location/radiation/quality/duration/timing/severity/associated sxs/prior treatment) Patient is a 27 y.o. female presenting with headaches. The history is provided by the patient.  Headache Pain location:  Frontal Quality:  Dull Radiates to: back of head. Pain severity now: severe. Onset quality:  Gradual Duration:  5 days Timing:  Constant Progression:  Worsening Chronicity:  New Context: bright light and emotional stress   Relieved by:  None tried Worsened by:  Light and sound Associated symptoms: nausea, photophobia and vomiting   Associated symptoms: no blurred vision, no fever and no focal weakness     Past Medical History  Diagnosis Date  . Asthma   . MRSA (methicillin resistant staph aureus) culture positive     Past Surgical History  Procedure Laterality Date  . Cesarean section    . External ear surgery      No family history on file.  History  Substance Use Topics  . Smoking status: Current Every Day Smoker -- 0.50 packs/day    Types: Cigarettes  . Smokeless tobacco: Not on file  . Alcohol Use: No    OB History   Grav Para Term Preterm Abortions TAB SAB Ect Mult Living   2 1 1  1  1   1       Review of Systems  Constitutional: Negative for fever.  Eyes: Positive for photophobia. Negative for blurred vision.  Gastrointestinal: Positive for nausea and vomiting.  Neurological: Positive for headaches. Negative for focal weakness.  All other systems reviewed and are negative.    Allergies  Review of patient's allergies indicates no known allergies.  Home Medications  No current outpatient prescriptions on file.  BP 127/80  Pulse 78  Temp(Src) 97.9 F (36.6 C) (Oral)  SpO2 100%  LMP 06/26/2012  Physical Exam  Nursing note and vitals  reviewed. Constitutional: She is oriented to person, place, and time. She appears well-developed and well-nourished. No distress.  HENT:  Head: Normocephalic and atraumatic.  Mouth/Throat: Oropharynx is clear and moist.  Eyes: Conjunctivae are normal. Pupils are equal, round, and reactive to light. No scleral icterus.  Neck: Neck supple.  Cardiovascular: Normal rate, regular rhythm, normal heart sounds and intact distal pulses.   No murmur heard. Pulmonary/Chest: Effort normal and breath sounds normal. No stridor. No respiratory distress. She has no rales.  Abdominal: Soft. Bowel sounds are normal. She exhibits no distension. There is no tenderness.  Musculoskeletal: Normal range of motion.  Neurological: She is alert and oriented to person, place, and time. She has normal strength. No cranial nerve deficit or sensory deficit. Coordination normal. GCS eye subscore is 4. GCS verbal subscore is 5. GCS motor subscore is 6.  Skin: Skin is warm and dry. No rash noted.  Psychiatric: She has a normal mood and affect. Her behavior is normal.    ED Course  Procedures (including critical care time)  Labs Reviewed  POCT PREGNANCY, URINE   No results found.   1. Headache       MDM  Well appearing.  Gradual onset frontal headache with photophobia.  No fevers, no meningismus.  Plan cocktail of IV Reglan, Decadron, Benadryl, IV fluid bolus.   10:19 AM Headache dramatically improved.           Rennis Petty, MD  06/29/12 1022 

## 2012-06-29 NOTE — ED Notes (Signed)
Headache for 5 days, behind her eyes. Having n/v. Pt. Is alert and oriented X4.

## 2012-06-29 NOTE — ED Provider Notes (Signed)
27 year old female presented with a four-day history of a bifrontal headache which was sharp and throbbing. There is associated photophobia, nausea, vomiting. Neurologic exam is normal. Her presentation was felt to be consistent with migraines and she responded well to a migraine cocktail. She is discharged with a prescription for metoclopramide.  I saw and evaluated the patient, reviewed the resident's note and I agree with the findings and plan.   Dione Booze, MD 06/29/12 1034

## 2012-10-09 ENCOUNTER — Emergency Department (HOSPITAL_COMMUNITY)
Admission: EM | Admit: 2012-10-09 | Discharge: 2012-10-09 | Disposition: A | Discharge status: Home / Self Care | Payer: Self-pay | Attending: Emergency Medicine | Admitting: Emergency Medicine

## 2012-10-09 ENCOUNTER — Inpatient Hospital Stay (HOSPITAL_COMMUNITY)
Admission: AD | Admit: 2012-10-09 | Discharge: 2012-10-09 | Disposition: A | Discharge status: Home / Self Care | Payer: Self-pay | Source: Ambulatory Visit | Attending: Obstetrics & Gynecology | Admitting: Obstetrics & Gynecology

## 2012-10-09 ENCOUNTER — Inpatient Hospital Stay (HOSPITAL_COMMUNITY): Payer: Self-pay

## 2012-10-09 ENCOUNTER — Encounter (HOSPITAL_COMMUNITY): Payer: Self-pay | Admitting: Emergency Medicine

## 2012-10-09 ENCOUNTER — Encounter (HOSPITAL_COMMUNITY): Payer: Self-pay | Admitting: *Deleted

## 2012-10-09 ENCOUNTER — Emergency Department (HOSPITAL_COMMUNITY): Payer: Self-pay

## 2012-10-09 DIAGNOSIS — J45909 Unspecified asthma, uncomplicated: Secondary | ICD-10-CM | POA: Insufficient documentation

## 2012-10-09 DIAGNOSIS — F17200 Nicotine dependence, unspecified, uncomplicated: Secondary | ICD-10-CM | POA: Insufficient documentation

## 2012-10-09 DIAGNOSIS — R350 Frequency of micturition: Secondary | ICD-10-CM | POA: Insufficient documentation

## 2012-10-09 DIAGNOSIS — E282 Polycystic ovarian syndrome: Secondary | ICD-10-CM | POA: Insufficient documentation

## 2012-10-09 DIAGNOSIS — Z8619 Personal history of other infectious and parasitic diseases: Secondary | ICD-10-CM | POA: Insufficient documentation

## 2012-10-09 DIAGNOSIS — R102 Pelvic and perineal pain: Secondary | ICD-10-CM

## 2012-10-09 DIAGNOSIS — M25559 Pain in unspecified hip: Secondary | ICD-10-CM | POA: Insufficient documentation

## 2012-10-09 DIAGNOSIS — Z3202 Encounter for pregnancy test, result negative: Secondary | ICD-10-CM | POA: Insufficient documentation

## 2012-10-09 DIAGNOSIS — R109 Unspecified abdominal pain: Secondary | ICD-10-CM | POA: Insufficient documentation

## 2012-10-09 DIAGNOSIS — N949 Unspecified condition associated with female genital organs and menstrual cycle: Secondary | ICD-10-CM | POA: Insufficient documentation

## 2012-10-09 DIAGNOSIS — N831 Corpus luteum cyst of ovary, unspecified side: Secondary | ICD-10-CM

## 2012-10-09 HISTORY — DX: Polycystic ovarian syndrome: E28.2

## 2012-10-09 LAB — URINALYSIS, ROUTINE W REFLEX MICROSCOPIC
Bilirubin Urine: NEGATIVE
Ketones, ur: NEGATIVE mg/dL
Leukocytes, UA: NEGATIVE
Nitrite: NEGATIVE
Protein, ur: NEGATIVE mg/dL

## 2012-10-09 MED ORDER — IBUPROFEN 600 MG PO TABS: 600.0000 mg | ORAL_TABLET | Freq: Four times a day (QID) | ORAL | Status: DC | PRN

## 2012-10-09 NOTE — ED Notes (Signed)
[facility] Misty Stanley, [place] told pt this was ok. Pt agreed to go straight there.

## 2012-10-09 NOTE — MAU Provider Note (Signed)
  History     CSN: [id number]  Arrival date and time: 10/09/12 1615   None     Chief Complaint  Patient presents with  . Abdominal Pain   HPI  Pt is a [id number] non pregnant pt seen today at [facility] ED for recurrent pelvic pain and diagnosed with possible ovarian torsion.  Pt declines STD screening.  Sent from [facility] ED for further evaluation per Dr. Penne Lash to reassess the left ovarian blood flow.    Past Medical History  Diagnosis Date  . Asthma   . MRSA (methicillin resistant staph aureus) culture positive   . PCOS (polycystic ovarian syndrome)     Past Surgical History  Procedure Laterality Date  . Cesarean section    . External ear surgery    . Dilation and curettage of uterus      History reviewed. No pertinent family history.  History  Substance Use Topics  . Smoking status: Current Every Day Smoker -- 0.50 packs/day    Types: Cigarettes  . Smokeless tobacco: Not on file  . Alcohol Use: No    Allergies: No Known Allergies  Prescriptions prior to admission  Medication Sig Dispense Refill  . OVER THE COUNTER MEDICATION         Review of Systems  Gastrointestinal: Positive for abdominal pain (pelvic pain).   Physical Exam   Blood pressure 113/78, pulse 89, temperature 98.4 F (36.9 C), temperature source Oral, resp. rate 18, height 5\' 6"  (1.676 m), weight 85.911 kg (189 lb 6.4 oz), last menstrual period 09/27/2012, SpO2 100.00%.  Physical Exam  Constitutional: She is oriented to person, place, and time. She appears well-developed and well-nourished. No distress.  Laughing in room with female partner  HENT:  Head: Normocephalic.  Eyes: Pupils are equal, round, and reactive to light.  Neck: Normal range of motion. Neck supple.  Cardiovascular: Normal rate, regular rhythm and normal heart sounds.   Respiratory: Effort normal and breath sounds normal.  GI: Soft. There is tenderness (LLQ).  Neurological: She is alert and oriented to person, place,  and time. She has normal reflexes.  Skin: Skin is warm and dry.    MAU Course  Procedures  Ultrasound: IMPRESSION:  1.5 cm left ovarian corpus luteum noted. No evidence of pelvic  mass or other significant abnormality.  No sonographic evidence for ovarian torsion.   1835 Consulted with Dr. Jolayne Panther > follow-up in GYN clinic   Assessment and Plan  Pelvic Pain  Plan: Discharge to home RX Ibuprofen Follow-up in GYN clinic in 4-6 weeks.  [facility] 10/09/2012, 6:37 PM

## 2012-10-09 NOTE — MAU Note (Signed)
Patient states she has been diagnosed with PCOS fora bout 10 years. Has been having left side pain off and on since February. Was seen at [facility] ED today and sent to MAU for further evaluation. Patient denies any bleeding or discharge.

## 2012-10-09 NOTE — ED Notes (Signed)
Pt c/o "knot" to left side of vaginal area x months with some intermittent pain

## 2012-10-09 NOTE — ED Provider Notes (Signed)
History    CSN: [id number] Arrival date & time 10/09/12  1020  First MD Initiated Contact with Patient 10/09/12 1107     Chief Complaint  Patient presents with  . Pelvic Pain   (Consider location/radiation/quality/duration/timing/severity/associated sxs/prior Treatment) The history is provided by the patient.   Patient is to the ED for pelvic pain.  States she has had this pain intermittently for the past several weeks, but worse over the past few days.  Pain described as a severe, sharp, stabbing sensation localized over her suprapubic area when it occurs, but non currently.  Patient states she can feel a knot in her pelvic region that seems to fluctuate in size with her menstrual cycle.  Pt has hx of ovarian cysts and PCOS.  Never been dx with fibroids.  Pt has not seen her OB-GYN in approx 5 years since the birth of her daughter via emergency c-section.  Notes increased urinary frequency and nausea.  No dysuria, hematuria, or vomiting.  BM normal and non-bloody.  No vaginal d/c. No new sexual partners or concern for STD.  Denies possibility of pregnancy.  Menstrual cycles always irregular with variable flow.  Past Medical History  Diagnosis Date  . Asthma   . MRSA (methicillin resistant staph aureus) culture positive   . PCOS (polycystic ovarian syndrome)    Past Surgical History  Procedure Laterality Date  . Cesarean section    . External ear surgery     History reviewed. No pertinent family history. History  Substance Use Topics  . Smoking status: Current Every Day Smoker -- 0.50 packs/day    Types: Cigarettes  . Smokeless tobacco: Not on file  . Alcohol Use: No   OB History   Grav Para Term Preterm Abortions TAB SAB Ect Mult Living   2 1 1  1  1   1      Review of Systems  Genitourinary: Positive for frequency and pelvic pain.  All other systems reviewed and are negative.    Allergies  Review of patient's allergies indicates no known allergies.  Home  Medications   Current Outpatient Rx  Name  Route  Sig  Dispense  Refill  . metoCLOPramide (REGLAN) 10 MG tablet   Oral   Take 1 tablet (10 mg total) by mouth every 6 (six) hours as needed (For nausea and headache).   10 tablet   0    BP 108/69  Pulse 92  Temp(Src) 98 F (36.7 C)  Resp 18  SpO2 97%  Physical Exam  Nursing note and vitals reviewed. Constitutional: She is oriented to person, place, and time. She appears well-developed and well-nourished. No distress.  HENT:  Head: Normocephalic and atraumatic.  Mouth/Throat: Oropharynx is clear and moist.  Eyes: Conjunctivae and EOM are normal. Pupils are equal, round, and reactive to light.  Neck: Normal range of motion. Neck supple.  Cardiovascular: Normal rate, regular rhythm and normal heart sounds.   Pulmonary/Chest: Effort normal and breath sounds normal. No respiratory distress. She has no wheezes.  Abdominal: Soft. Bowel sounds are normal. There is tenderness in the suprapubic area. There is no guarding, no CVA tenderness, no tenderness at McBurney's point and negative Murphy's sign.  Intermittent pain suprapubically  Musculoskeletal: Normal range of motion. She exhibits no edema.  Neurological: She is alert and oriented to person, place, and time.  Skin: Skin is warm and dry.  Psychiatric: She has a normal mood and affect.    ED Course  Procedures (including critical  care time) Labs Reviewed  URINALYSIS, ROUTINE W REFLEX MICROSCOPIC  POCT PREGNANCY, URINE   US Transvaginal Non-ob  10/09/2012   *RADIOLOGY REPORT*  Clinical Data: Foot pain, history of ruptured ovarian cysts  DOPPLER ULTRASOUND OF OVARIES  Technique:  Color and duplex Doppler ultrasound was utilized to evaluate blood flow to the ovaries.  Comparison:  None.  Findings: The uterus measures 9.6 x 4.9 x 6.1 cm.  No myometrial abnormality is seen.  The endometrium measures 13.4 mm in thickness and appears homogeneous.  The right ovary is normal in size  measuring 2.7 x 2.4 x 2.4 cm. Arterial and venous blood flow is documented to the right ovary.  The left ovary measures 3.4 x 2.5 x 2.4 cm.  A small cyst is present of 1.6 cm in diameter.  However, neither of arterial or venous blood flow is well demonstrated to the left ovary.  I did observe the Doppler assessment of both ovaries and there is definitely good flow to the right ovary, with perhaps minimal peripheral flow on the left.  This could indicate an intermittent torsion of the left ovary.  IMPRESSION:  1.  Very little blood flow is demonstrated to the left ovary compared to the normal appearance of the right ovary, possibly indicating intermittent torsion of the left ovary. 2.  No myometrial or endometrial abnormality.  Critical Value/emergent results were called by telephone at the time of interpretation on 10/09/2012 at 1:30 p.m. to Sharilyn Sites, PA in the emergency department, who verbally acknowledged these results.   Original Report Authenticated By: Dwyane Dee, M.D.   US Pelvis Complete  10/09/2012   *RADIOLOGY REPORT*  Clinical Data: Foot pain, history of ruptured ovarian cysts  DOPPLER ULTRASOUND OF OVARIES  Technique:  Color and duplex Doppler ultrasound was utilized to evaluate blood flow to the ovaries.  Comparison:  None.  Findings: The uterus measures 9.6 x 4.9 x 6.1 cm.  No myometrial abnormality is seen.  The endometrium measures 13.4 mm in thickness and appears homogeneous.  The right ovary is normal in size measuring 2.7 x 2.4 x 2.4 cm. Arterial and venous blood flow is documented to the right ovary.  The left ovary measures 3.4 x 2.5 x 2.4 cm.  A small cyst is present of 1.6 cm in diameter.  However, neither of arterial or venous blood flow is well demonstrated to the left ovary.  I did observe the Doppler assessment of both ovaries and there is definitely good flow to the right ovary, with perhaps minimal peripheral flow on the left.  This could indicate an intermittent torsion of the  left ovary.  IMPRESSION:  1.  Very little blood flow is demonstrated to the left ovary compared to the normal appearance of the right ovary, possibly indicating intermittent torsion of the left ovary. 2.  No myometrial or endometrial abnormality.  Critical Value/emergent results were called by telephone at the time of interpretation on 10/09/2012 at 1:30 p.m. to Sharilyn Sites, PA in the emergency department, who verbally acknowledged these results.   Original Report Authenticated By: Dwyane Dee, M.D.   [place] Art/ven Flow Abd Pelv Doppler  10/09/2012   *RADIOLOGY REPORT*  Clinical Data: Foot pain, history of ruptured ovarian cysts  DOPPLER ULTRASOUND OF OVARIES  Technique:  Color and duplex Doppler ultrasound was utilized to evaluate blood flow to the ovaries.  Comparison:  None.  Findings: The uterus measures 9.6 x 4.9 x 6.1 cm.  No myometrial abnormality is seen.  The endometrium  measures 13.4 mm in thickness and appears homogeneous.  The right ovary is normal in size measuring 2.7 x 2.4 x 2.4 cm. Arterial and venous blood flow is documented to the right ovary.  The left ovary measures 3.4 x 2.5 x 2.4 cm.  A small cyst is present of 1.6 cm in diameter.  However, neither of arterial or venous blood flow is well demonstrated to the left ovary.  I did observe the Doppler assessment of both ovaries and there is definitely good flow to the right ovary, with perhaps minimal peripheral flow on the left.  This could indicate an intermittent torsion of the left ovary.  IMPRESSION:  1.  Very little blood flow is demonstrated to the left ovary compared to the normal appearance of the right ovary, possibly indicating intermittent torsion of the left ovary. 2.  No myometrial or endometrial abnormality.  Critical Value/emergent results were called by telephone at the time of interpretation on 10/09/2012 at 1:30 p.m. to Sharilyn Sites, PA in the emergency department, who verbally acknowledged these results.   Original Report  Authenticated By: Dwyane Dee, M.D.   1. Pelvic pain   2. PCOS (polycystic ovarian syndrome)     MDM   u-preg negative.  U/a without signs of infection.  Notified by radiology, Dr. Gery Pray, that there is minimal blood flow to left ovary- given intermittent nature of sx >1 month, questionable intermittent torsion.  Consulted OB-GYN, Dr. Penne Lash who has reviewed u/s and requests that pt be transferred to MAU for further evaluation.  Pt is agreeable with this plan but wants to drive herself to [facility]- given directions and advised to go directly there, staff is awaiting her arrival.  Garlon Hatchet, Cordelia Poche 10/09/12 1927

## 2012-10-10 NOTE — MAU Provider Note (Signed)
Attestation of Attending Supervision of Advanced Practitioner (CNM/NP): Evaluation and management procedures were performed by the Advanced Practitioner under my supervision and collaboration.  I have reviewed the Advanced Practitioner's note and chart, and I agree with the management and plan.  Jejuan Scala 10/10/2012 8:01 AM

## 2012-10-10 NOTE — ED Provider Notes (Signed)
Medical screening examination/treatment/procedure(s) were performed by non-physician practitioner and as supervising physician I was immediately available for consultation/collaboration.   Charles B. Sheldon, MD 10/10/12 2109 

## 2012-11-06 ENCOUNTER — Ambulatory Visit (INDEPENDENT_AMBULATORY_CARE_PROVIDER_SITE_OTHER): Payer: Self-pay | Admitting: Obstetrics and Gynecology

## 2012-11-06 ENCOUNTER — Encounter: Payer: Self-pay | Admitting: Obstetrics and Gynecology

## 2012-11-06 VITALS — BP 121/72 | HR 87 | Temp 97.5°F | Ht 66.75 in | Wt 194.2 lb

## 2012-11-06 DIAGNOSIS — N83209 Unspecified ovarian cyst, unspecified side: Secondary | ICD-10-CM

## 2012-11-06 MED ORDER — OXYCODONE-ACETAMINOPHEN 5-325 MG PO TABS: 1.0000 | ORAL_TABLET | ORAL | Status: DC | PRN

## 2012-11-06 NOTE — Progress Notes (Signed)
  Subjective:    Patient ID: Taylor Campbell, female    DOB: 16-Dec-1985, 27 y.o.   MRN: [id number]  HPI  27 yo G3P1021 with LMP 10/24/2012 presenting today as an MAU follow up for evaluation of pelvic pain secondary to an ovarian cyst. Patient was seen on 6/30 and was noted to have a 1.5 cm left ovarian corpus luteum cyst. Patient states that since her discharge the pain has persisted, remains in the LLQ, non-radiating, not relieved by ibuprofen and there are no aggravating factors. Patient continues to have normal menses. She states she was diagnosed with PCOS at a young age. No history of irregular cycles. Patient is sexually active and does not want anything for contraception at this time. She would welcome a pregnancy if it occurred.  Past Medical History  Diagnosis Date  . Asthma   . MRSA (methicillin resistant staph aureus) culture positive   . PCOS (polycystic ovarian syndrome)    Past Surgical History  Procedure Laterality Date  . Cesarean section    . External ear surgery    . Dilation and curettage of uterus     No family history on file. History  Substance Use Topics  . Smoking status: Current Every Day Smoker -- 0.50 packs/day    Types: Cigarettes  . Smokeless tobacco: Not on file  . Alcohol Use: No     Review of Systems     Objective:   Physical Exam GENERAL: Well-developed, well-nourished female in no acute distress.  ABDOMEN: Soft, nontender, nondistended. No organomegaly. PELVIC: Normal external female genitalia. Uterus is normal in size. Left adnexal tenderness. No palpable masses.  EXTREMITIES: No cyanosis, clubbing, or edema, 2+ distal pulses.     Assessment & Plan:  27 yo with LLQ pain  - Will schedule f/u ultrasound  - Rx percocet provided - RTC in 2 weeks for results and further management if indicated

## 2012-11-10 ENCOUNTER — Ambulatory Visit (HOSPITAL_COMMUNITY)
Admission: RE | Admit: 2012-11-10 | Discharge: 2012-11-10 | Disposition: A | Discharge status: Home / Self Care | Payer: Self-pay | Source: Ambulatory Visit | Attending: Obstetrics and Gynecology | Admitting: Obstetrics and Gynecology

## 2012-11-10 DIAGNOSIS — N83209 Unspecified ovarian cyst, unspecified side: Secondary | ICD-10-CM | POA: Insufficient documentation

## 2012-11-10 DIAGNOSIS — R1032 Left lower quadrant pain: Secondary | ICD-10-CM | POA: Insufficient documentation

## 2012-11-14 ENCOUNTER — Telehealth: Payer: Self-pay | Admitting: General Practice

## 2012-11-14 NOTE — Telephone Encounter (Signed)
Called patient and informed her of results and recommendations if pain is still persisting. Patient verbalized understanding and had no further questions

## 2012-11-14 NOTE — Telephone Encounter (Signed)
Message copied by Kathee Delton on Tue Nov 14, 2012  3:25 PM ------      Message from: CONSTANT, PEGGY      Created: Mon Nov 13, 2012  3:20 PM       Please inform patient of normal pelvic ultrasound, with complete resolution of left ovarian cyst. There is no GYN cause for her pain. Patient to follow up with PCP for further evaluation, if pain persists.            Peggy ------

## 2012-12-11 ENCOUNTER — Encounter (HOSPITAL_COMMUNITY): Payer: Self-pay | Admitting: Medical

## 2012-12-11 ENCOUNTER — Inpatient Hospital Stay (HOSPITAL_COMMUNITY)
Admission: AD | Admit: 2012-12-11 | Discharge: 2012-12-11 | Disposition: A | Discharge status: Home / Self Care | Payer: Self-pay | Source: Ambulatory Visit | Attending: Obstetrics and Gynecology | Admitting: Obstetrics and Gynecology

## 2012-12-11 DIAGNOSIS — Z3201 Encounter for pregnancy test, result positive: Secondary | ICD-10-CM

## 2012-12-11 DIAGNOSIS — O210 Mild hyperemesis gravidarum: Secondary | ICD-10-CM | POA: Insufficient documentation

## 2012-12-11 HISTORY — DX: Unspecified abnormal cytological findings in specimens from cervix uteri: R87.619

## 2012-12-11 HISTORY — DX: Depression, unspecified: F32.A

## 2012-12-11 HISTORY — DX: Reserved for concepts with insufficient information to code with codable children: IMO0002

## 2012-12-11 HISTORY — DX: Unspecified infectious disease: B99.9

## 2012-12-11 HISTORY — DX: Major depressive disorder, single episode, unspecified: F32.9

## 2012-12-11 LAB — POCT PREGNANCY, URINE: Preg Test, Ur: POSITIVE — AB

## 2012-12-11 MED ORDER — PROMETHAZINE HCL 25 MG PO TABS: 25.0000 mg | ORAL_TABLET | Freq: Four times a day (QID) | ORAL | Status: DC | PRN

## 2012-12-11 NOTE — MAU Provider Note (Signed)
Ms. ELIVIA ROBOTHAM is a  27 y.o.  [phone] at [redacted]w[redacted]d who presents to MAU today for a pregnancy confirmation. The patient states that she did 5 HPT and 2 were positive but the others were "blank." She has had N/V recently, but denies bleeding or abdominal pain.    BP 112/72  Pulse 83  Temp(Src) 98 F (36.7 C) (Oral)  Resp 18  Ht 5\' 5"  (1.651 m)  Wt 186 lb (84.369 kg)  BMI 30.95 kg/m2  LMP 10/24/2012 GENERAL: Well-developed, well-nourished female in no acute distress.  HEENT: Normocephalic, atraumatic.   LUNGS: Effort normal HEART: Regular rate  SKIN: Warm, dry and without erythema PSYCH: Normal mood and affect  Results for orders placed during the hospital encounter of 12/11/12 (from the past 24 hour(s))  POCT PREGNANCY, URINE     Status: Abnormal   Collection Time    12/11/12 12:25 PM      Result Value Range   Preg Test, Ur POSITIVE (*) NEGATIVE    A: Positive pregnancy test Nausea and vomiting in pregnancy  P: Discharge home Rx for phenergan sent to patient's pharmacy Patient referred to [facility] clinic to start prenatal care Pregnancy confirmation given and medicaid assistance information given First trimester warning signs discussed Patient may return to MAU as needed  Freddi Starr, PA-C 12/11/2012 12:37 PM

## 2012-12-11 NOTE — MAU Note (Signed)
A wk ago took a preg test, was "blank". 2nd was inconclusive- blank.  Did a 3rd test on Sat was pos. Next was inconclusive, and 5th test was pos. Been feeling sick. ? Hx of PCOS.

## 2012-12-12 NOTE — MAU Provider Note (Signed)
Attestation of Attending Supervision of Advanced Practitioner (CNM/NP): Evaluation and management procedures were performed by the Advanced Practitioner under my supervision and collaboration.  I have reviewed the Advanced Practitioner's note and chart, and I agree with the management and plan.  Jayra Choyce 12/12/2012 8:08 AM

## 2012-12-31 ENCOUNTER — Inpatient Hospital Stay (HOSPITAL_COMMUNITY)
Admission: AD | Admit: 2012-12-31 | Discharge: 2012-12-31 | Disposition: A | Discharge status: Home / Self Care | Payer: Medicaid Other | Source: Ambulatory Visit | Attending: Obstetrics & Gynecology | Admitting: Obstetrics & Gynecology

## 2012-12-31 ENCOUNTER — Inpatient Hospital Stay (HOSPITAL_COMMUNITY): Payer: Medicaid Other

## 2012-12-31 ENCOUNTER — Encounter (HOSPITAL_COMMUNITY): Payer: Self-pay | Admitting: Family

## 2012-12-31 DIAGNOSIS — Z3490 Encounter for supervision of normal pregnancy, unspecified, unspecified trimester: Secondary | ICD-10-CM

## 2012-12-31 DIAGNOSIS — O26899 Other specified pregnancy related conditions, unspecified trimester: Secondary | ICD-10-CM

## 2012-12-31 DIAGNOSIS — W19XXXA Unspecified fall, initial encounter: Secondary | ICD-10-CM

## 2012-12-31 DIAGNOSIS — R109 Unspecified abdominal pain: Secondary | ICD-10-CM

## 2012-12-31 DIAGNOSIS — Y92009 Unspecified place in unspecified non-institutional (private) residence as the place of occurrence of the external cause: Secondary | ICD-10-CM | POA: Insufficient documentation

## 2012-12-31 DIAGNOSIS — B9689 Other specified bacterial agents as the cause of diseases classified elsewhere: Secondary | ICD-10-CM | POA: Insufficient documentation

## 2012-12-31 DIAGNOSIS — N760 Acute vaginitis: Secondary | ICD-10-CM | POA: Insufficient documentation

## 2012-12-31 DIAGNOSIS — O209 Hemorrhage in early pregnancy, unspecified: Secondary | ICD-10-CM | POA: Insufficient documentation

## 2012-12-31 DIAGNOSIS — A499 Bacterial infection, unspecified: Secondary | ICD-10-CM | POA: Insufficient documentation

## 2012-12-31 DIAGNOSIS — R296 Repeated falls: Secondary | ICD-10-CM | POA: Insufficient documentation

## 2012-12-31 DIAGNOSIS — O99891 Other specified diseases and conditions complicating pregnancy: Secondary | ICD-10-CM | POA: Insufficient documentation

## 2012-12-31 DIAGNOSIS — O2390 Unspecified genitourinary tract infection in pregnancy, unspecified trimester: Secondary | ICD-10-CM | POA: Insufficient documentation

## 2012-12-31 LAB — OB RESULTS CONSOLE GC/CHLAMYDIA
Chlamydia: NEGATIVE
Gonorrhea: NEGATIVE

## 2012-12-31 LAB — URINALYSIS, ROUTINE W REFLEX MICROSCOPIC
Bilirubin Urine: NEGATIVE
Ketones, ur: NEGATIVE mg/dL
Leukocytes, UA: NEGATIVE
Nitrite: NEGATIVE
Protein, ur: NEGATIVE mg/dL

## 2012-12-31 LAB — WET PREP, GENITAL

## 2012-12-31 MED ORDER — METRONIDAZOLE 500 MG PO TABS: 500.0000 mg | ORAL_TABLET | Freq: Two times a day (BID) | ORAL | Status: DC

## 2012-12-31 NOTE — MAU Note (Signed)
27 yo, G4P1 at [redacted]w[redacted]d, presents to MAU s/p fall onto hands and knees at 1100 this morning; reports she tripped over the dog onto hardwood floors. Reports cramping at 1300 today, with spotting twice at 1500. Reports cramping subsided after spotting began. Denies bleeding/spotting since that time.

## 2012-12-31 NOTE — MAU Note (Signed)
Pt presents with complaints of vaginal spotting when she wipes, abdominal pain, and she fell today but did not land on her abdomen.

## 2012-12-31 NOTE — MAU Provider Note (Signed)
History     CSN: [id number]  Arrival date and time: 12/31/12 1635   First Provider Initiated Contact with Patient 12/31/12 1746      Chief Complaint  Patient presents with  . Vaginal Bleeding  . Abdominal Pain  . Fall   HPI Taylor Campbell is 27 y.o. [phone] [redacted]w[redacted]d weeks presenting with report that she got tangled up with dog and fell at 10:30am today.  At 1pm she had abdominal pain and at 2:30pm she saw spotting, no active bleeding at this time.   She is concerned bc she has a hx of miscarriage and has not begun prenatal care.  She has appt with the GYN CLINIC next month.    Past Medical History  Diagnosis Date  . Asthma   . MRSA (methicillin resistant staph aureus) culture positive   . PCOS (polycystic ovarian syndrome)   . Infection     UTI  . Depression     situation has improved  . Abnormal Pap smear     cancer age 41    Past Surgical History  Procedure Laterality Date  . Cesarean section    . External ear surgery    . Dilation and curettage of uterus      Family History  Problem Relation Age of Onset  . Diabetes Mother   . Hypertension Mother   . Heart disease Maternal Grandmother   . Hypertension Maternal Grandmother   . Diabetes Maternal Grandmother   . Kidney disease Maternal Grandfather     failure  . Heart disease Maternal Grandfather   . Hypertension Maternal Grandfather     History  Substance Use Topics  . Smoking status: Current Every Day Smoker -- 0.25 packs/day for 13 years    Types: Cigarettes  . Smokeless tobacco: Current User     Comment: cutting down; e sig  . Alcohol Use: No    Allergies: No Known Allergies  Prescriptions prior to admission  Medication Sig Dispense Refill  . promethazine (PHENERGAN) 25 MG tablet Take 1 tablet (25 mg total) by mouth every 6 (six) hours as needed for nausea.  30 tablet  0    Review of Systems  Constitutional: Negative for fever and chills.  Eyes: Eye pain: lower abdominal pain.  Gastrointestinal:  Positive for abdominal pain.  Genitourinary:       + for vaginal spotting.  Neurological: Negative for headaches.   Physical Exam   Blood pressure 121/73, pulse 97, temperature 98 F (36.7 C), temperature source Oral, resp. rate 16, height 5\' 5"  (1.651 m), weight 188 lb (85.276 kg), last menstrual period 10/24/2012.  Physical Exam  Constitutional: She is oriented to person, place, and time. She appears well-developed and well-nourished. No distress.  HENT:  Head: Normocephalic.  Neck: Normal range of motion.  Cardiovascular: Normal rate.   Respiratory: Effort normal.  GI: Soft. She exhibits no distension and no mass. There is no tenderness. There is no rebound and no guarding.  Genitourinary: There is no rash, tenderness or lesion on the right labia. There is no rash, tenderness or lesion on the left labia. Uterus is enlarged (8-9 week size). Uterus is not tender. Cervix exhibits no discharge and no friability. Right adnexum displays no mass, no tenderness and no fullness. Left adnexum displays no mass, no tenderness and no fullness. No bleeding around the vagina. Injury: small amount of white discharge wtihout odor.  Neurological: She is alert and oriented to person, place, and time.  Skin: Skin is warm  and dry.  Psychiatric: She has a normal mood and affect. Her behavior is normal.   Results for orders placed during the hospital encounter of 12/31/12 (from the past 24 hour(s))  URINALYSIS, ROUTINE W REFLEX MICROSCOPIC     Status: Abnormal   Collection Time    12/31/12  4:50 PM      Result Value Range   Color, Urine YELLOW  YELLOW   APPearance CLEAR  CLEAR   Specific Gravity, Urine >1.030 (*) 1.005 - 1.030   pH 6.0  5.0 - 8.0   Glucose, UA NEGATIVE  NEGATIVE mg/dL   Hgb urine dipstick NEGATIVE  NEGATIVE   Bilirubin Urine NEGATIVE  NEGATIVE   Ketones, ur NEGATIVE  NEGATIVE mg/dL   Protein, ur NEGATIVE  NEGATIVE mg/dL   Urobilinogen, UA 0.2  0.0 - 1.0 mg/dL   Nitrite NEGATIVE   NEGATIVE   Leukocytes, UA NEGATIVE  NEGATIVE  WET PREP, GENITAL     Status: Abnormal   Collection Time    12/31/12  5:55 PM      Result Value Range   Yeast Wet Prep HPF POC NONE SEEN  NONE SEEN   Trich, Wet Prep NONE SEEN  NONE SEEN   Clue Cells Wet Prep HPF POC MANY (*) NONE SEEN   WBC, Wet Prep HPF POC MODERATE (*) NONE SEEN   US Ob Comp Less 14 Wks  12/31/2012   CLINICAL DATA:  First trimester pregnancy with spotting. LMP 10/24/2012.  EXAM: OBSTETRIC <14 WK ULTRASOUND  TECHNIQUE: Transabdominal ultrasound was performed for evaluation of the gestation as well as the maternal uterus and adnexal regions.  COMPARISON:  11/10/2012.  FINDINGS: Intrauterine gestational sac: Visualized/normal in shape.  Yolk sac:  Visualized.  Embryo:  Visualized.  Cardiac Activity: Visualized.  Heart Rate: 169 bpm  CRL:   27.9  mm   9 w 5 d                  [place] EDC: 07/31/2013  Maternal uterus/adnexae: A small subchorionic hematoma is noted. Both maternal ovaries are visualized and appear normal. There is no adnexal mass or significant free pelvic fluid.  IMPRESSION: Single live intrauterine gestation with best estimated gestational age of [redacted] weeks 5 days. Small subchorionic hematoma.   Electronically Signed   By: Roxy Horseman   On: 12/31/2012 19:46  MAU Course  Procedures  GC/CHL culture to labd  MDM  Assessment and Plan  A:  Abdominal pain in early pregnancy after fall      Bacterial vaginosis      Viable intrauterine pregnancy at [redacted]w[redacted]d gestation with small subchorionic hemorrhage  P:  Explained lab and U/S findings with the patient       Pelvic rest until bleeding not seen       Fall safety discussed      Rx for Flagyl X 1 week      Keep scheduled appointment with GYN CLINIC  KEY,EVE M 12/31/2012, 7:56 PM

## 2013-01-01 LAB — GC/CHLAMYDIA PROBE AMP: GC Probe RNA: NEGATIVE

## 2013-01-17 ENCOUNTER — Encounter: Payer: Self-pay | Admitting: *Deleted

## 2013-01-17 ENCOUNTER — Ambulatory Visit (INDEPENDENT_AMBULATORY_CARE_PROVIDER_SITE_OTHER): Payer: Medicaid Other | Admitting: Family Medicine

## 2013-01-17 ENCOUNTER — Encounter: Payer: Self-pay | Admitting: Family Medicine

## 2013-01-17 VITALS — BP 118/72 | Temp 98.7°F | Wt 186.5 lb

## 2013-01-17 DIAGNOSIS — Z98891 History of uterine scar from previous surgery: Secondary | ICD-10-CM

## 2013-01-17 DIAGNOSIS — Z3480 Encounter for supervision of other normal pregnancy, unspecified trimester: Secondary | ICD-10-CM | POA: Insufficient documentation

## 2013-01-17 DIAGNOSIS — O34219 Maternal care for unspecified type scar from previous cesarean delivery: Secondary | ICD-10-CM | POA: Insufficient documentation

## 2013-01-17 DIAGNOSIS — Z23 Encounter for immunization: Secondary | ICD-10-CM

## 2013-01-17 DIAGNOSIS — Z3481 Encounter for supervision of other normal pregnancy, first trimester: Secondary | ICD-10-CM

## 2013-01-17 LAB — US OB LIMITED

## 2013-01-17 NOTE — Progress Notes (Signed)
Informal [place] for FHR = 170 bpm.  Fetal activity present.  Dr. Reola Calkins notified of results

## 2013-01-17 NOTE — Patient Instructions (Signed)
Pregnancy - First Trimester  During sexual intercourse, millions of sperm go into the vagina. Only 1 sperm will penetrate and fertilize the female egg while it is in the Fallopian tube. One week later, the fertilized egg implants into the wall of the uterus. An embryo begins to develop into a baby. At 6 to 8 weeks, the eyes and face are formed and the heartbeat can be seen on ultrasound. At the end of 12 weeks (first trimester), all the baby's organs are formed. Now that you are pregnant, you will want to do everything you can to have a healthy baby. Two of the most important things are to get good prenatal care and follow your caregiver's instructions. Prenatal care is all the medical care you receive before the baby's birth. It is given to prevent, find, and treat problems during the pregnancy and childbirth.  PRENATAL EXAMS  · During prenatal visits, your weight, blood pressure, and urine are checked. This is done to make sure you are healthy and progressing normally during the pregnancy.  · A pregnant woman should gain 25 to 35 pounds during the pregnancy. However, if you are overweight or underweight, your caregiver will advise you regarding your weight.  · Your caregiver will ask and answer questions for you.  · Blood work, cervical cultures, other necessary tests, and a Pap test are done during your prenatal exams. These tests are done to check on your health and the probable health of your baby. Tests are strongly recommended and done for HIV with your permission. This is the virus that causes AIDS. These tests are done because medicines can be given to help prevent your baby from being born with this infection should you have been infected without knowing it. Blood work is also used to find out your blood type, previous infections, and follow your blood levels (hemoglobin).  · Low hemoglobin (anemia) is common during pregnancy. Iron and vitamins are given to help prevent this. Later in the pregnancy, blood  tests for diabetes will be done along with any other tests if any problems develop.  · You may need other tests to make sure you and the baby are doing well.  CHANGES DURING THE FIRST TRIMESTER   Your body goes through many changes during pregnancy. They vary from person to person. Talk to your caregiver about changes you notice and are concerned about. Changes can include:  · Your menstrual period stops.  · The egg and sperm carry the genes that determine what you look like. Genes from you and your partner are forming a baby. The female genes determine whether the baby is a boy or a girl.  · Your body increases in girth and you may feel bloated.  · Feeling sick to your stomach (nauseous) and throwing up (vomiting). If the vomiting is uncontrollable, call your caregiver.  · Your breasts will begin to enlarge and become tender.  · Your nipples may stick out more and become darker.  · The need to urinate more. Painful urination may mean you have a bladder infection.  · Tiring easily.  · Loss of appetite.  · Cravings for certain kinds of food.  · At first, you may gain or lose a couple of pounds.  · You may have changes in your emotions from day to day (excited to be pregnant or concerned something may go wrong with the pregnancy and baby).  · You may have more vivid and strange dreams.  HOME CARE INSTRUCTIONS   ·   It is very important to avoid all smoking, alcohol and non-prescribed drugs during your pregnancy. These affect the formation and growth of the baby. Avoid chemicals while pregnant to ensure the delivery of a healthy infant.  · Start your prenatal visits by the 12th week of pregnancy. They are usually scheduled monthly at first, then more often in the last 2 months before delivery. Keep your caregiver's appointments. Follow your caregiver's instructions regarding medicine use, blood and lab tests, exercise, and diet.  · During pregnancy, you are providing food for you and your baby. Eat regular, well-balanced  meals. Choose foods such as meat, fish, milk and other low fat dairy products, vegetables, fruits, and whole-grain breads and cereals. Your caregiver will tell you of the ideal weight gain.  · You can help morning sickness by keeping soda crackers at the bedside. Eat a couple before arising in the morning. You may want to use the crackers without salt on them.  · Eating 4 to 5 small meals rather than 3 large meals a day also may help the nausea and vomiting.  · Drinking liquids between meals instead of during meals also seems to help nausea and vomiting.  · A physical sexual relationship may be continued throughout pregnancy if there are no other problems. Problems may be early (premature) leaking of amniotic fluid from the membranes, vaginal bleeding, or belly (abdominal) pain.  · Exercise regularly if there are no restrictions. Check with your caregiver or physical therapist if you are unsure of the safety of some of your exercises. Greater weight gain will occur in the last 2 trimesters of pregnancy. Exercising will help:  · Control your weight.  · Keep you in shape.  · Prepare you for labor and delivery.  · Help you lose your pregnancy weight after you deliver your baby.  · Wear a good support or jogging bra for breast tenderness during pregnancy. This may help if worn during sleep too.  · Ask when prenatal classes are available. Begin classes when they are offered.  · Do not use hot tubs, steam rooms, or saunas.  · Wear your seat belt when driving. This protects you and your baby if you are in an accident.  · Avoid raw meat, uncooked cheese, cat litter boxes, and soil used by cats throughout the pregnancy. These carry germs that can cause birth defects in the baby.  · The first trimester is a good time to visit your dentist for your dental health. Getting your teeth cleaned is okay. Use a softer toothbrush and brush gently during pregnancy.  · Ask for help if you have financial, counseling, or nutritional needs  during pregnancy. Your caregiver will be able to offer counseling for these needs as well as refer you for other special needs.  · Do not take any medicines or herbs unless told by your caregiver.  · Inform your caregiver if there is any mental or physical domestic violence.  · Make a list of emergency phone numbers of family, friends, hospital, and police and fire departments.  · Write down your questions. Take them to your prenatal visit.  · Do not douche.  · Do not cross your legs.  · If you have to stand for long periods of time, rotate you feet or take small steps in a circle.  · You may have more vaginal secretions that may require a sanitary pad. Do not use tampons or scented sanitary pads.  MEDICINES AND DRUG USE IN PREGNANCY  ·   Take prenatal vitamins as directed. The vitamin should contain 1 milligram of folic acid. Keep all vitamins out of reach of children. Only a couple vitamins or tablets containing iron may be fatal to a baby or young child when ingested.  · Avoid use of all medicines, including herbs, over-the-counter medicines, not prescribed or suggested by your caregiver. Only take over-the-counter or prescription medicines for pain, discomfort, or fever as directed by your caregiver. Do not use aspirin, ibuprofen, or naproxen unless directed by your caregiver.  · Let your caregiver also know about herbs you may be using.  · Alcohol is related to a number of birth defects. This includes fetal alcohol syndrome. All alcohol, in any form, should be avoided completely. Smoking will cause low birth rate and premature babies.  · Street or illegal drugs are very harmful to the baby. They are absolutely forbidden. A baby born to an addicted mother will be addicted at birth. The baby will go through the same withdrawal an adult does.  · Let your caregiver know about any medicines that you have to take and for what reason you take them.  SEEK MEDICAL CARE IF:   You have any concerns or worries during your  pregnancy. It is better to call with your questions if you feel they cannot wait, rather than worry about them.  SEEK IMMEDIATE MEDICAL CARE IF:   · An unexplained oral temperature above 102° F (38.9° C) develops, or as your caregiver suggests.  · You have leaking of fluid from the vagina (birth canal). If leaking membranes are suspected, take your temperature and inform your caregiver of this when you call.  · There is vaginal spotting or bleeding. Notify your caregiver of the amount and how many pads are used.  · You develop a bad smelling vaginal discharge with a change in the color.  · You continue to feel sick to your stomach (nauseated) and have no relief from remedies suggested. You vomit blood or coffee ground-like materials.  · You lose more than 2 pounds of weight in 1 week.  · You gain more than 2 pounds of weight in 1 week and you notice swelling of your face, hands, feet, or legs.  · You gain 5 pounds or more in 1 week (even if you do not have swelling of your hands, face, legs, or feet).  · You get exposed to German measles and have never had them.  · You are exposed to fifth disease or chickenpox.  · You develop belly (abdominal) pain. Round ligament discomfort is a common non-cancerous (benign) cause of abdominal pain in pregnancy. Your caregiver still must evaluate this.  · You develop headache, fever, diarrhea, pain with urination, or shortness of breath.  · You fall or are in a car accident or have any kind of trauma.  · There is mental or physical violence in your home.  Document Released: 03/23/2001 Document Revised: 12/22/2011 Document Reviewed: 09/24/2008  [employer]® Patient Information ©2014 [employer].

## 2013-01-17 NOTE — Progress Notes (Signed)
Subjective:    Taylor Campbell is being seen today for her first obstetrical visit.  This is a planned pregnancy. She is at [redacted]w[redacted]d gestation. Her obstetrical history is significant for hx of stat c/s for fetal distress and failure to dilate, tobacco abuse, MJN abuse. Desire VBAC. Relationship with FOB: significant other, not living together. Patient does intend to breast feed. Pregnancy history fully reviewed.   No symptoms. Some mild nausea. No fevers, chills, SOB, CP, abd pain.   Menstrual History: OB History   Grav Para Term Preterm Abortions TAB SAB Ect Mult Living   4 1 1  2  2   1      G1- SAB with D&C G2- TERM. Emergency c/s G3- SAB  Patient's last menstrual period was 10/24/2012.   Past Medical History  Diagnosis Date  . Asthma   . MRSA (methicillin resistant staph aureus) culture positive   . PCOS (polycystic ovarian syndrome)   . Infection     UTI  . Depression     situation has improved  . Abnormal Pap smear     cancer age 89  . Complication of anesthesia   . Spinal headache   . Substance abuse     marijuana  . Shingles 1999  . HPV (human papilloma virus) infection    Past Surgical History  Procedure Laterality Date  . Cesarean section    . External ear surgery    . Dilation and curettage of uterus    . Incision and drainage of wound  2010    right groin/upper thigh-mrsa   Family History  Problem Relation Age of Onset  . Diabetes Mother   . Hypertension Mother   . COPD Mother   . Heart disease Maternal Grandmother   . Hypertension Maternal Grandmother   . Diabetes Maternal Grandmother   . Kidney disease Maternal Grandfather     failure  . Heart disease Maternal Grandfather   . Hypertension Maternal Grandfather    History   Social History  . Marital Status: Divorced    Spouse Name: N/A    Number of Children: N/A  . Years of Education: N/A   Occupational History  . Not on file.   Social History Main Topics  . Smoking status: Current Every Day  Smoker -- 0.25 packs/day for 13 years    Types: Cigarettes  . Smokeless tobacco: Current User     Comment: cutting down; e sig  . Alcohol Use: No  . Drug Use: Yes    Special: Marijuana     Comment: last was end of Aug  . Sexual Activity: Yes    Birth Control/ Protection: None   Other Topics Concern  . Not on file   Social History Narrative  . No narrative on file    The following portions of the patient's history were reviewed and updated as appropriate: allergies, current medications, past family history, past medical history, past social history, past surgical history and problem list.  Review of Systems A comprehensive review of systems was negative.    Objective:   BP 118/72  Temp(Src) 98.7 F (37.1 C)  Wt 186 lb 8 oz (84.596 kg)  BMI 31.04 kg/m2  LMP 10/24/2012 GENERAL: Well-developed, well-nourished female in no acute distress. Obese. HEENT: Normocephalic, atraumatic. Sclerae anicteric.  NECK: Supple. Normal thyroid.  LUNGS: Clear to auscultation bilaterally.  HEART: Regular rate and rhythm. BREASTS: Symmetric in size. No masses, skin changes, nipple drainage, or lymphadenopathy. ABDOMEN: Soft, nontender, nondistended. No organomegaly.  PELVIC: Normal external female genitalia. Vagina is pink and rugated.  Normal discharge. Normal cervix contour. Pap smear obtained. Uterus difficult to feel fully due to maternal habitus but appropriate for dates. No adnexal mass or tenderness.  EXTREMITIES: No cyanosis, clubbing, or edema, 2+ distal pulses.       Assessment:     Supervision of normal subsequent pregnancy, first trimester - Plan: Obstetric panel, Glucose Tolerance, 1 HR (50g), HIV antibody, Culture, OB Urine, Cytology - PAP, POCT Urine Drug Screen        Plan:    Initial labs drawn. Early 1 hour for family hx of DM Weight gain discussed  HR obtained on [place] - baseline 170 bpm Prenatal vitamins. Problem list reviewed and updated. Role of ultrasound in  pregnancy discussed; fetal survey: discussed, plan to order next time.. Follow up in 4 weeks. 50% of 30 min visit spent on counseling and coordination of care.

## 2013-01-17 NOTE — Addendum Note (Signed)
Addended by: Toula Moos on: 01/17/2013 11:07 AM   Modules accepted: Orders

## 2013-01-17 NOTE — Progress Notes (Signed)
P=84. Here for initial ob visit. C/o bump on her LLQ. Given new patient information. Discussed bmi. Appropriate weight gain.

## 2013-01-18 LAB — POCT URINALYSIS DIP (DEVICE)
Bilirubin Urine: NEGATIVE
Glucose, UA: NEGATIVE mg/dL
Ketones, ur: NEGATIVE mg/dL
Leukocytes, UA: NEGATIVE
Nitrite: NEGATIVE
pH: 8.5 — ABNORMAL HIGH (ref 5.0–8.0)

## 2013-01-18 LAB — OBSTETRIC PANEL
Basophils Relative: 0 % (ref 0–1)
Eosinophils Absolute: 0.1 10*3/uL (ref 0.0–0.7)
Hemoglobin: 13.4 g/dL (ref 12.0–15.0)
Hepatitis B Surface Ag: NEGATIVE
MCHC: 34.7 g/dL (ref 30.0–36.0)
Monocytes Relative: 5 % (ref 3–12)
Neutro Abs: 7.8 10*3/uL — ABNORMAL HIGH (ref 1.7–7.7)
Neutrophils Relative %: 76 % (ref 43–77)
Platelets: 180 10*3/uL (ref 150–400)
RBC: 4.53 MIL/uL (ref 3.87–5.11)
Rh Type: POSITIVE

## 2013-01-18 LAB — HIV ANTIBODY (ROUTINE TESTING W REFLEX): HIV: NONREACTIVE

## 2013-01-19 LAB — CULTURE, OB URINE
Colony Count: NO GROWTH
Organism ID, Bacteria: NO GROWTH

## 2013-01-29 ENCOUNTER — Telehealth: Payer: Self-pay | Admitting: *Deleted

## 2013-01-29 NOTE — Telephone Encounter (Signed)
See telephone note.

## 2013-01-29 NOTE — Telephone Encounter (Signed)
Called [facility] and left a message we are calling with some information from your provider and to schedule an appointment- please call clinic

## 2013-01-29 NOTE — Telephone Encounter (Signed)
Message copied by Gerome Apley on Mon Jan 29, 2013  3:05 PM ------      Message from: [place]      Created: Thu Jan 18, 2013  6:45 PM       Pt needs 3 hour gtt for borderline 1 hour. Thanks! ------

## 2013-01-30 NOTE — Telephone Encounter (Addendum)
Pt left a message stating that she is returning our call. She can be reached @ tel # [phone]. I called her back and had to leave another message. I stated that we need to know if it is ok to leave detailed medical information on her voice mail. I then called pt on tel # [phone] and reached her. I explained the need for 3hr GTT due to abnormal 1hr GTT.  Pt voiced understanding and agreed to have test on 10/24 @ 0830.

## 2013-02-02 ENCOUNTER — Other Ambulatory Visit: Payer: Medicaid Other

## 2013-02-02 DIAGNOSIS — Z3480 Encounter for supervision of other normal pregnancy, unspecified trimester: Secondary | ICD-10-CM

## 2013-02-02 DIAGNOSIS — O99810 Abnormal glucose complicating pregnancy: Secondary | ICD-10-CM

## 2013-02-02 LAB — GLUCOSE TOLERANCE, 3 HOURS
Glucose Tolerance, 1 hour: 127 mg/dL (ref 70–189)
Glucose Tolerance, 2 hour: 86 mg/dL (ref 70–164)
Glucose Tolerance, Fasting: 77 mg/dL (ref 70–104)
Glucose, GTT - 3 Hour: 87 mg/dL (ref 70–144)

## 2013-02-12 ENCOUNTER — Ambulatory Visit (INDEPENDENT_AMBULATORY_CARE_PROVIDER_SITE_OTHER): Payer: Medicaid Other | Admitting: Obstetrics & Gynecology

## 2013-02-12 ENCOUNTER — Encounter: Payer: Self-pay | Admitting: Obstetrics & Gynecology

## 2013-02-12 VITALS — BP 128/72 | Temp 97.2°F | Wt 186.0 lb

## 2013-02-12 DIAGNOSIS — Z3482 Encounter for supervision of other normal pregnancy, second trimester: Secondary | ICD-10-CM

## 2013-02-12 DIAGNOSIS — O34219 Maternal care for unspecified type scar from previous cesarean delivery: Secondary | ICD-10-CM

## 2013-02-12 LAB — POCT URINALYSIS DIP (DEVICE)
Glucose, UA: NEGATIVE mg/dL
Hgb urine dipstick: NEGATIVE
Specific Gravity, Urine: >=1.03 (ref 1.005–1.030)

## 2013-02-12 NOTE — Progress Notes (Signed)
Patient reports low appetite since she stopped smoking marijuana, will refer to Nutrition for any suggestions. Recommended OTC remedies for constipation.  Also recommended going to Urgent Care for evaluation of her ears.   Quad screen to be done today, anatomy scan ordered.  No other complaints or concerns.  Routine obstetric precautions reviewed.

## 2013-02-12 NOTE — Patient Instructions (Signed)
Return to clinic for any obstetric concerns or go to [facility] for evaluation  

## 2013-02-12 NOTE — Progress Notes (Signed)
Pulse- 87  Pain-in eyes Pt reports having ear drainage and it has a smell to it Pt reports having severe constipation

## 2013-02-14 ENCOUNTER — Encounter: Payer: Self-pay | Admitting: *Deleted

## 2013-02-14 LAB — AFP, QUAD SCREEN
AFP: 17.6 IU/mL
Age Alone: 1:917 {titer}
Curr Gest Age: 15.6 wks.days
HCG, Total: 12357 m[IU]/mL
MoM for AFP: 0.68
MoM for INH: 0.65
Open Spina bifida: NEGATIVE
Tri 18 Scr Risk Est: NEGATIVE
Trisomy 18 (Edward) Syndrome Interp.: 1:1950 {titer}
uE3 Value: 0.3 ng/mL

## 2013-02-15 ENCOUNTER — Encounter: Payer: Self-pay | Admitting: Obstetrics & Gynecology

## 2013-03-05 ENCOUNTER — Encounter: Payer: Self-pay | Admitting: Obstetrics & Gynecology

## 2013-03-05 ENCOUNTER — Other Ambulatory Visit: Payer: Self-pay | Admitting: Obstetrics & Gynecology

## 2013-03-05 ENCOUNTER — Ambulatory Visit (HOSPITAL_COMMUNITY)
Admission: RE | Admit: 2013-03-05 | Discharge: 2013-03-05 | Disposition: A | Discharge status: Home / Self Care | Payer: Medicaid Other | Source: Ambulatory Visit | Attending: Obstetrics & Gynecology | Admitting: Obstetrics & Gynecology

## 2013-03-05 ENCOUNTER — Ambulatory Visit (HOSPITAL_COMMUNITY): Admission: RE | Admit: 2013-03-05 | Payer: Medicaid Other | Source: Ambulatory Visit

## 2013-03-05 DIAGNOSIS — Z3482 Encounter for supervision of other normal pregnancy, second trimester: Secondary | ICD-10-CM

## 2013-03-05 DIAGNOSIS — O358XX0 Maternal care for other (suspected) fetal abnormality and damage, not applicable or unspecified: Secondary | ICD-10-CM | POA: Insufficient documentation

## 2013-03-05 DIAGNOSIS — Z363 Encounter for antenatal screening for malformations: Secondary | ICD-10-CM | POA: Insufficient documentation

## 2013-03-05 DIAGNOSIS — Z1389 Encounter for screening for other disorder: Secondary | ICD-10-CM | POA: Insufficient documentation

## 2013-03-05 DIAGNOSIS — O99330 Smoking (tobacco) complicating pregnancy, unspecified trimester: Secondary | ICD-10-CM | POA: Insufficient documentation

## 2013-03-05 DIAGNOSIS — O34219 Maternal care for unspecified type scar from previous cesarean delivery: Secondary | ICD-10-CM | POA: Insufficient documentation

## 2013-07-19 ENCOUNTER — Other Ambulatory Visit: Payer: Self-pay | Admitting: Obstetrics and Gynecology

## 2013-07-20 ENCOUNTER — Encounter (HOSPITAL_COMMUNITY): Payer: Self-pay

## 2013-07-20 ENCOUNTER — Encounter (HOSPITAL_COMMUNITY)
Admission: RE | Admit: 2013-07-20 | Discharge: 2013-07-20 | Disposition: A | Discharge status: Home / Self Care | Payer: Medicaid Other | Source: Ambulatory Visit | Attending: Obstetrics and Gynecology | Admitting: Obstetrics and Gynecology

## 2013-07-20 DIAGNOSIS — Z01812 Encounter for preprocedural laboratory examination: Secondary | ICD-10-CM | POA: Insufficient documentation

## 2013-07-20 LAB — TYPE AND SCREEN
ABO/RH(D): O POS
Antibody Screen: NEGATIVE

## 2013-07-20 LAB — CBC
HEMATOCRIT: 34.9 % — AB (ref 36.0–46.0)
HEMOGLOBIN: 11.9 g/dL — AB (ref 12.0–15.0)
MCH: 31.1 pg (ref 26.0–34.0)
MCHC: 34.1 g/dL (ref 30.0–36.0)
MCV: 91.1 fL (ref 78.0–100.0)
Platelets: 118 10*3/uL — ABNORMAL LOW (ref 150–400)
RBC: 3.83 MIL/uL — AB (ref 3.87–5.11)
RDW: 13 % (ref 11.5–15.5)
WBC: 9.7 10*3/uL (ref 4.0–10.5)

## 2013-07-20 LAB — RPR

## 2013-07-20 LAB — SURGICAL PCR SCREEN
MRSA, PCR: NEGATIVE
STAPHYLOCOCCUS AUREUS: NEGATIVE

## 2013-07-20 LAB — ABO/RH: ABO/RH(D): O POS

## 2013-07-20 NOTE — Patient Instructions (Signed)
20 Taylor Campbell  07/20/2013   Your procedure is scheduled on:  07/24/13  [facility].  Pick up the phone at the desk and dial [phone].   Call this number if you have problems the morning of surgery: [phone]   Remember:   Do not eat food:After Midnight.  Do not drink clear liquids: After Midnight.  Take these medicines the morning of surgery with A SIP OF WATER: NA   Do not wear jewelry, make-up or nail polish.  Do not wear lotions, powders, or perfumes. You may wear deodorant.  Do not shave 24 hours prior to surgery.  Do not bring valuables to the hospital.  [facility] is not   responsible for any belongings or valuables brought to the hospital.  Contacts, dentures or bridgework may not be worn into surgery.  Leave suitcase in the car. After surgery it may be brought to your room.  For patients admitted to the hospital, checkout time is 11:00 AM the day of              discharge.   Patients discharged the day of surgery will not be allowed to drive             home.  Name and phone number of your driver: NA  Special Instructions:   Shower using CHG 2 nights before surgery and the night before surgery.  If you shower the day of surgery use CHG.  Use special wash - you have one bottle of CHG for all showers.  You should use approximately 1/3 of the bottle for each shower.   Please read over the following fact sheets that you were given:   Surgical Site Infection Prevention

## 2013-07-24 ENCOUNTER — Encounter (HOSPITAL_COMMUNITY): Payer: Medicaid Other | Admitting: Certified Registered"

## 2013-07-24 ENCOUNTER — Encounter (HOSPITAL_COMMUNITY): Payer: Self-pay | Admitting: Certified Registered"

## 2013-07-24 ENCOUNTER — Encounter (HOSPITAL_COMMUNITY)
Admission: RE | Disposition: A | Discharge status: Home / Self Care | Payer: Self-pay | Source: Ambulatory Visit | Attending: Obstetrics and Gynecology

## 2013-07-24 ENCOUNTER — Inpatient Hospital Stay (HOSPITAL_COMMUNITY): Payer: Medicaid Other | Admitting: Certified Registered"

## 2013-07-24 ENCOUNTER — Inpatient Hospital Stay (HOSPITAL_COMMUNITY)
Admission: RE | Admit: 2013-07-24 | Discharge: 2013-07-27 | DRG: 765 | Disposition: A | Discharge status: Home / Self Care | Payer: Medicaid Other | Source: Ambulatory Visit | Attending: Obstetrics and Gynecology | Admitting: Obstetrics and Gynecology

## 2013-07-24 DIAGNOSIS — Z9151 Personal history of suicidal behavior: Secondary | ICD-10-CM

## 2013-07-24 DIAGNOSIS — O99334 Smoking (tobacco) complicating childbirth: Secondary | ICD-10-CM | POA: Diagnosis present

## 2013-07-24 DIAGNOSIS — O9912 Other diseases of the blood and blood-forming organs and certain disorders involving the immune mechanism complicating childbirth: Secondary | ICD-10-CM

## 2013-07-24 DIAGNOSIS — F329 Major depressive disorder, single episode, unspecified: Secondary | ICD-10-CM | POA: Diagnosis present

## 2013-07-24 DIAGNOSIS — Z8614 Personal history of Methicillin resistant Staphylococcus aureus infection: Secondary | ICD-10-CM | POA: Diagnosis not present

## 2013-07-24 DIAGNOSIS — Z915 Personal history of self-harm: Secondary | ICD-10-CM

## 2013-07-24 DIAGNOSIS — F32A Depression, unspecified: Secondary | ICD-10-CM | POA: Diagnosis present

## 2013-07-24 DIAGNOSIS — B069 Rubella without complication: Secondary | ICD-10-CM | POA: Diagnosis present

## 2013-07-24 DIAGNOSIS — O9989 Other specified diseases and conditions complicating pregnancy, childbirth and the puerperium: Secondary | ICD-10-CM

## 2013-07-24 DIAGNOSIS — O3440 Maternal care for other abnormalities of cervix, unspecified trimester: Secondary | ICD-10-CM | POA: Diagnosis not present

## 2013-07-24 DIAGNOSIS — Z283 Underimmunization status: Secondary | ICD-10-CM

## 2013-07-24 DIAGNOSIS — F17200 Nicotine dependence, unspecified, uncomplicated: Secondary | ICD-10-CM | POA: Diagnosis present

## 2013-07-24 DIAGNOSIS — O9852 Other viral diseases complicating childbirth: Secondary | ICD-10-CM

## 2013-07-24 DIAGNOSIS — F1290 Cannabis use, unspecified, uncomplicated: Secondary | ICD-10-CM | POA: Diagnosis not present

## 2013-07-24 DIAGNOSIS — Z2839 Other underimmunization status: Secondary | ICD-10-CM

## 2013-07-24 DIAGNOSIS — F1210 Cannabis abuse, uncomplicated: Secondary | ICD-10-CM | POA: Diagnosis present

## 2013-07-24 DIAGNOSIS — O99344 Other mental disorders complicating childbirth: Secondary | ICD-10-CM | POA: Diagnosis present

## 2013-07-24 DIAGNOSIS — D696 Thrombocytopenia, unspecified: Secondary | ICD-10-CM | POA: Diagnosis present

## 2013-07-24 DIAGNOSIS — O34219 Maternal care for unspecified type scar from previous cesarean delivery: Principal | ICD-10-CM | POA: Diagnosis present

## 2013-07-24 DIAGNOSIS — O894 Spinal and epidural anesthesia-induced headache during the puerperium: Secondary | ICD-10-CM | POA: Diagnosis not present

## 2013-07-24 DIAGNOSIS — Z9889 Other specified postprocedural states: Secondary | ICD-10-CM | POA: Diagnosis not present

## 2013-07-24 DIAGNOSIS — F429 Obsessive-compulsive disorder, unspecified: Secondary | ICD-10-CM | POA: Diagnosis not present

## 2013-07-24 DIAGNOSIS — O09899 Supervision of other high risk pregnancies, unspecified trimester: Secondary | ICD-10-CM

## 2013-07-24 DIAGNOSIS — F319 Bipolar disorder, unspecified: Secondary | ICD-10-CM | POA: Diagnosis not present

## 2013-07-24 DIAGNOSIS — J45909 Unspecified asthma, uncomplicated: Secondary | ICD-10-CM | POA: Diagnosis not present

## 2013-07-24 DIAGNOSIS — D689 Coagulation defect, unspecified: Secondary | ICD-10-CM | POA: Diagnosis present

## 2013-07-24 DIAGNOSIS — M4120 Other idiopathic scoliosis, site unspecified: Secondary | ICD-10-CM

## 2013-07-24 LAB — CBC
HCT: 32.7 % — ABNORMAL LOW (ref 36.0–46.0)
Hemoglobin: 11.1 g/dL — ABNORMAL LOW (ref 12.0–15.0)
MCH: 30.3 pg (ref 26.0–34.0)
MCHC: 33.9 g/dL (ref 30.0–36.0)
MCV: 89.3 fL (ref 78.0–100.0)
Platelets: 111 10*3/uL — ABNORMAL LOW (ref 150–400)
RBC: 3.66 MIL/uL — ABNORMAL LOW (ref 3.87–5.11)
RDW: 12.9 % (ref 11.5–15.5)
WBC: 8 10*3/uL (ref 4.0–10.5)

## 2013-07-24 LAB — TYPE AND SCREEN
ABO/RH(D): O POS
ANTIBODY SCREEN: NEGATIVE

## 2013-07-24 SURGERY — Surgical Case
Anesthesia: Spinal

## 2013-07-24 MED ORDER — IBUPROFEN 600 MG PO TABS: 600.0000 mg | ORAL_TABLET | Freq: Four times a day (QID) | ORAL | Status: DC | PRN

## 2013-07-24 MED ORDER — MORPHINE SULFATE 0.5 MG/ML IJ SOLN
INTRAMUSCULAR | Status: AC
  Filled 2013-07-24: qty 10

## 2013-07-24 MED ORDER — CEFAZOLIN SODIUM-DEXTROSE 2-3 GM-% IV SOLR
INTRAVENOUS | Status: AC
  Filled 2013-07-24: qty 50

## 2013-07-24 MED ORDER — IBUPROFEN 600 MG PO TABS
600.0000 mg | ORAL_TABLET | Freq: Four times a day (QID) | ORAL | Status: DC
  Administered 2013-07-24 – 2013-07-25 (×2): 600 mg via ORAL
  Filled 2013-07-24 (×2): qty 1

## 2013-07-24 MED ORDER — SERTRALINE HCL 50 MG PO TABS
50.0000 mg | ORAL_TABLET | Freq: Every day | ORAL | Status: DC
Start: 2013-07-24 — End: 2013-07-27
  Administered 2013-07-24 – 2013-07-27 (×4): 50 mg via ORAL
  Filled 2013-07-24 (×4): qty 1

## 2013-07-24 MED ORDER — OXYTOCIN 10 UNIT/ML IJ SOLN
40.0000 [IU] | INTRAVENOUS | Status: DC | PRN
  Administered 2013-07-24: 40 [IU] via INTRAVENOUS

## 2013-07-24 MED ORDER — NALOXONE HCL 1 MG/ML IJ SOLN
1.0000 ug/kg/h | INTRAVENOUS | Status: DC | PRN
  Filled 2013-07-24: qty 2

## 2013-07-24 MED ORDER — FENTANYL CITRATE 0.05 MG/ML IJ SOLN
INTRAMUSCULAR | Status: DC | PRN
  Administered 2013-07-24: 75 ug via INTRAVENOUS
  Administered 2013-07-24: 25 ug via INTRATHECAL

## 2013-07-24 MED ORDER — PANTOPRAZOLE SODIUM 20 MG PO TBEC
20.0000 mg | DELAYED_RELEASE_TABLET | Freq: Every day | ORAL | Status: DC
  Administered 2013-07-24 – 2013-07-26 (×3): 20 mg via ORAL
  Filled 2013-07-24 (×4): qty 1

## 2013-07-24 MED ORDER — FERROUS SULFATE 325 (65 FE) MG PO TABS
325.0000 mg | ORAL_TABLET | Freq: Two times a day (BID) | ORAL | Status: DC
  Administered 2013-07-24 – 2013-07-27 (×6): 325 mg via ORAL
  Filled 2013-07-24 (×6): qty 1

## 2013-07-24 MED ORDER — FENTANYL CITRATE 0.05 MG/ML IJ SOLN
INTRAMUSCULAR | Status: AC
  Filled 2013-07-24: qty 2

## 2013-07-24 MED ORDER — OXYCODONE-ACETAMINOPHEN 5-325 MG PO TABS
1.0000 | ORAL_TABLET | ORAL | Status: DC | PRN
  Administered 2013-07-25: 1 via ORAL
  Administered 2013-07-25: 2 via ORAL
  Administered 2013-07-25 – 2013-07-26 (×4): 1 via ORAL
  Administered 2013-07-26 – 2013-07-27 (×3): 2 via ORAL
  Administered 2013-07-27: 1 via ORAL
  Filled 2013-07-24: qty 2
  Filled 2013-07-24 (×7): qty 1
  Filled 2013-07-24: qty 2
  Filled 2013-07-24: qty 1

## 2013-07-24 MED ORDER — SODIUM CHLORIDE 0.9 % IJ SOLN: 3.0000 mL | INTRAMUSCULAR | Status: DC | PRN

## 2013-07-24 MED ORDER — DIPHENHYDRAMINE HCL 25 MG PO CAPS: 25.0000 mg | ORAL_CAPSULE | Freq: Four times a day (QID) | ORAL | Status: DC | PRN

## 2013-07-24 MED ORDER — PROMETHAZINE HCL 25 MG/ML IJ SOLN: 6.2500 mg | INTRAMUSCULAR | Status: DC | PRN

## 2013-07-24 MED ORDER — ONDANSETRON HCL 4 MG/2ML IJ SOLN
INTRAMUSCULAR | Status: AC
  Filled 2013-07-24: qty 2

## 2013-07-24 MED ORDER — BUPIVACAINE IN DEXTROSE 0.75-8.25 % IT SOLN
INTRATHECAL | Status: DC | PRN
  Administered 2013-07-24: 1.6 mL via INTRATHECAL

## 2013-07-24 MED ORDER — SCOPOLAMINE 1 MG/3DAYS TD PT72
MEDICATED_PATCH | TRANSDERMAL | Status: AC
  Administered 2013-07-24: 1.5 mg via TRANSDERMAL
  Filled 2013-07-24: qty 1

## 2013-07-24 MED ORDER — BUPIVACAINE HCL (PF) 0.25 % IJ SOLN
INTRAMUSCULAR | Status: DC | PRN
  Administered 2013-07-24: 20 mL

## 2013-07-24 MED ORDER — SCOPOLAMINE 1 MG/3DAYS TD PT72
1.0000 | MEDICATED_PATCH | TRANSDERMAL | Status: DC
  Administered 2013-07-24: 1.5 mg via TRANSDERMAL

## 2013-07-24 MED ORDER — PHENYLEPHRINE 8 MG IN D5W 100 ML (0.08MG/ML) PREMIX OPTIME
INJECTION | INTRAVENOUS | Status: DC | PRN
  Administered 2013-07-24: 60 ug/min via INTRAVENOUS

## 2013-07-24 MED ORDER — ONDANSETRON HCL 4 MG/2ML IJ SOLN
4.0000 mg | Freq: Three times a day (TID) | INTRAMUSCULAR | Status: DC | PRN
  Filled 2013-07-24: qty 2

## 2013-07-24 MED ORDER — MORPHINE SULFATE (PF) 0.5 MG/ML IJ SOLN
INTRAMUSCULAR | Status: DC | PRN
  Administered 2013-07-24: .15 mg via INTRATHECAL

## 2013-07-24 MED ORDER — MEASLES, MUMPS & RUBELLA VAC ~~LOC~~ INJ
0.5000 mL | INJECTION | Freq: Once | SUBCUTANEOUS | Status: AC
  Administered 2013-07-27: 0.5 mL via SUBCUTANEOUS
  Filled 2013-07-24 (×2): qty 0.5

## 2013-07-24 MED ORDER — LACTATED RINGERS IV SOLN
INTRAVENOUS | Status: DC | PRN
  Administered 2013-07-24 (×2): via INTRAVENOUS
  Administered 2013-07-24: 1000 mL
  Administered 2013-07-24 (×2): via INTRAVENOUS

## 2013-07-24 MED ORDER — ACETAMINOPHEN 500 MG PO TABS
1000.0000 mg | ORAL_TABLET | Freq: Four times a day (QID) | ORAL | Status: AC
  Administered 2013-07-24: 1000 mg via ORAL
  Filled 2013-07-24: qty 2

## 2013-07-24 MED ORDER — ZOLPIDEM TARTRATE 5 MG PO TABS: 5.0000 mg | ORAL_TABLET | Freq: Every evening | ORAL | Status: DC | PRN

## 2013-07-24 MED ORDER — WITCH HAZEL-GLYCERIN EX PADS: 1.0000 "application " | MEDICATED_PAD | CUTANEOUS | Status: DC | PRN

## 2013-07-24 MED ORDER — METOCLOPRAMIDE HCL 5 MG/ML IJ SOLN: 10.0000 mg | Freq: Three times a day (TID) | INTRAMUSCULAR | Status: DC | PRN

## 2013-07-24 MED ORDER — MEDROXYPROGESTERONE ACETATE 150 MG/ML IM SUSP
150.0000 mg | INTRAMUSCULAR | Status: AC | PRN
  Administered 2013-07-27: 150 mg via INTRAMUSCULAR

## 2013-07-24 MED ORDER — LANOLIN HYDROUS EX OINT: 1.0000 "application " | TOPICAL_OINTMENT | CUTANEOUS | Status: DC | PRN

## 2013-07-24 MED ORDER — FENTANYL CITRATE 0.05 MG/ML IJ SOLN: 25.0000 ug | INTRAMUSCULAR | Status: DC | PRN

## 2013-07-24 MED ORDER — METHYLERGONOVINE MALEATE 0.2 MG/ML IJ SOLN: 0.2000 mg | INTRAMUSCULAR | Status: DC | PRN

## 2013-07-24 MED ORDER — SENNOSIDES-DOCUSATE SODIUM 8.6-50 MG PO TABS
2.0000 | ORAL_TABLET | ORAL | Status: DC
  Administered 2013-07-25 – 2013-07-26 (×3): 2 via ORAL
  Filled 2013-07-24 (×4): qty 2

## 2013-07-24 MED ORDER — MEPERIDINE HCL 25 MG/ML IJ SOLN: 6.2500 mg | INTRAMUSCULAR | Status: DC | PRN

## 2013-07-24 MED ORDER — ONDANSETRON HCL 4 MG/2ML IJ SOLN
INTRAMUSCULAR | Status: DC | PRN
Start: 2013-07-24 — End: 2013-07-24
  Administered 2013-07-24: 4 mg via INTRAVENOUS

## 2013-07-24 MED ORDER — OXYTOCIN 40 UNITS IN LACTATED RINGERS INFUSION - SIMPLE MED: 62.5000 mL/h | INTRAVENOUS | Status: AC

## 2013-07-24 MED ORDER — PRENATAL MULTIVITAMIN CH
1.0000 | ORAL_TABLET | Freq: Every day | ORAL | Status: DC
  Administered 2013-07-25 – 2013-07-27 (×3): 1 via ORAL
  Filled 2013-07-24 (×4): qty 1

## 2013-07-24 MED ORDER — SIMETHICONE 80 MG PO CHEW
80.0000 mg | CHEWABLE_TABLET | ORAL | Status: DC
  Administered 2013-07-25 – 2013-07-26 (×2): 80 mg via ORAL
  Filled 2013-07-24 (×4): qty 1

## 2013-07-24 MED ORDER — DIPHENHYDRAMINE HCL 50 MG/ML IJ SOLN: 12.5000 mg | INTRAMUSCULAR | Status: DC | PRN

## 2013-07-24 MED ORDER — PHENYLEPHRINE 40 MCG/ML (10ML) SYRINGE FOR IV PUSH (FOR BLOOD PRESSURE SUPPORT)
PREFILLED_SYRINGE | INTRAVENOUS | Status: AC
  Filled 2013-07-24: qty 10

## 2013-07-24 MED ORDER — LACTATED RINGERS IV SOLN
INTRAVENOUS | Status: DC
  Administered 2013-07-24: 17:00:00 via INTRAVENOUS

## 2013-07-24 MED ORDER — CEFAZOLIN SODIUM-DEXTROSE 2-3 GM-% IV SOLR
2.0000 g | INTRAVENOUS | Status: AC
  Administered 2013-07-24: 2 g via INTRAVENOUS

## 2013-07-24 MED ORDER — DIPHENHYDRAMINE HCL 50 MG/ML IJ SOLN: 25.0000 mg | INTRAMUSCULAR | Status: DC | PRN

## 2013-07-24 MED ORDER — OXYTOCIN 10 UNIT/ML IJ SOLN
INTRAMUSCULAR | Status: AC
  Filled 2013-07-24: qty 4

## 2013-07-24 MED ORDER — SIMETHICONE 80 MG PO CHEW: 80.0000 mg | CHEWABLE_TABLET | ORAL | Status: DC | PRN

## 2013-07-24 MED ORDER — ONDANSETRON HCL 4 MG PO TABS
4.0000 mg | ORAL_TABLET | ORAL | Status: DC | PRN
Start: 2013-07-24 — End: 2013-07-27

## 2013-07-24 MED ORDER — ONDANSETRON HCL 4 MG/2ML IJ SOLN
4.0000 mg | INTRAMUSCULAR | Status: DC | PRN
  Administered 2013-07-24: 4 mg via INTRAVENOUS

## 2013-07-24 MED ORDER — BUPIVACAINE HCL (PF) 0.25 % IJ SOLN
INTRAMUSCULAR | Status: AC
  Filled 2013-07-24: qty 30

## 2013-07-24 MED ORDER — SIMETHICONE 80 MG PO CHEW
80.0000 mg | CHEWABLE_TABLET | Freq: Three times a day (TID) | ORAL | Status: DC
  Administered 2013-07-24 – 2013-07-27 (×8): 80 mg via ORAL
  Filled 2013-07-24 (×9): qty 1

## 2013-07-24 MED ORDER — SCOPOLAMINE 1 MG/3DAYS TD PT72: 1.0000 | MEDICATED_PATCH | Freq: Once | TRANSDERMAL | Status: DC

## 2013-07-24 MED ORDER — DIBUCAINE 1 % RE OINT: 1.0000 "application " | TOPICAL_OINTMENT | RECTAL | Status: DC | PRN

## 2013-07-24 MED ORDER — MENTHOL 3 MG MT LOZG: 1.0000 | LOZENGE | OROMUCOSAL | Status: DC | PRN

## 2013-07-24 MED ORDER — METHYLERGONOVINE MALEATE 0.2 MG PO TABS: 0.2000 mg | ORAL_TABLET | ORAL | Status: DC | PRN

## 2013-07-24 MED ORDER — NALBUPHINE HCL 10 MG/ML IJ SOLN
5.0000 mg | INTRAMUSCULAR | Status: DC | PRN
  Administered 2013-07-24: 10 mg via SUBCUTANEOUS

## 2013-07-24 MED ORDER — DIPHENHYDRAMINE HCL 25 MG PO CAPS
25.0000 mg | ORAL_CAPSULE | ORAL | Status: DC | PRN
  Administered 2013-07-25 (×3): 25 mg via ORAL
  Filled 2013-07-24 (×4): qty 1

## 2013-07-24 MED ORDER — PHENYLEPHRINE 8 MG IN D5W 100 ML (0.08MG/ML) PREMIX OPTIME
INJECTION | INTRAVENOUS | Status: AC
  Filled 2013-07-24: qty 100

## 2013-07-24 MED ORDER — NALBUPHINE HCL 10 MG/ML IJ SOLN
5.0000 mg | INTRAMUSCULAR | Status: DC | PRN
  Administered 2013-07-24: 10 mg via INTRAVENOUS
  Filled 2013-07-24: qty 1

## 2013-07-24 MED ORDER — NALBUPHINE HCL 10 MG/ML IJ SOLN
INTRAMUSCULAR | Status: AC
  Administered 2013-07-24: 10 mg via SUBCUTANEOUS
  Filled 2013-07-24: qty 1

## 2013-07-24 MED ORDER — PHENYLEPHRINE HCL 10 MG/ML IJ SOLN
INTRAMUSCULAR | Status: DC | PRN
  Administered 2013-07-24: 120 ug via INTRAVENOUS
  Administered 2013-07-24 (×4): 40 ug via INTRAVENOUS
  Administered 2013-07-24: 80 ug via INTRAVENOUS
  Administered 2013-07-24: 40 ug via INTRAVENOUS

## 2013-07-24 MED ORDER — LACTATED RINGERS IV SOLN
Freq: Once | INTRAVENOUS | Status: AC
  Administered 2013-07-24: 09:00:00 via INTRAVENOUS

## 2013-07-24 MED ORDER — NALOXONE HCL 0.4 MG/ML IJ SOLN: 0.4000 mg | INTRAMUSCULAR | Status: DC | PRN

## 2013-07-24 MED ORDER — MIDAZOLAM HCL 2 MG/2ML IJ SOLN: 0.5000 mg | Freq: Once | INTRAMUSCULAR | Status: DC | PRN

## 2013-07-24 SURGICAL SUPPLY — 38 items
BARRIER ADHS 3X4 INTERCEED (GAUZE/BANDAGES/DRESSINGS) ×3 IMPLANT
BENZOIN TINCTURE PRP APPL 2/3 (GAUZE/BANDAGES/DRESSINGS) ×3 IMPLANT
BOOTIES KNEE HIGH SLOAN (MISCELLANEOUS) ×6 IMPLANT
CLAMP CORD UMBIL (MISCELLANEOUS) IMPLANT
CLOSURE WOUND 1/2 X4 (GAUZE/BANDAGES/DRESSINGS) ×2
CLOTH BEACON ORANGE TIMEOUT ST (SAFETY) ×3 IMPLANT
DRAIN JACKSON PRT FLT 10 (DRAIN) ×3 IMPLANT
DRAPE LG THREE QUARTER DISP (DRAPES) IMPLANT
DRSG OPSITE POSTOP 4X10 (GAUZE/BANDAGES/DRESSINGS) ×3 IMPLANT
DURAPREP 26ML APPLICATOR (WOUND CARE) ×3 IMPLANT
ELECT REM PT RETURN 9FT ADLT (ELECTROSURGICAL) ×3
ELECTRODE REM PT RTRN 9FT ADLT (ELECTROSURGICAL) ×1 IMPLANT
EVACUATOR SILICONE 100CC (DRAIN) ×3 IMPLANT
EXTRACTOR VACUUM M CUP 4 TUBE (SUCTIONS) IMPLANT
EXTRACTOR VACUUM M CUP 4' TUBE (SUCTIONS)
GLOVE BIOGEL PI IND STRL 7.0 (GLOVE) ×1 IMPLANT
GLOVE BIOGEL PI INDICATOR 7.0 (GLOVE) ×2
GLOVE ECLIPSE 6.5 STRL STRAW (GLOVE) ×3 IMPLANT
GOWN STRL REUS W/TWL LRG LVL3 (GOWN DISPOSABLE) ×6 IMPLANT
KIT ABG SYR 3ML LUER SLIP (SYRINGE) IMPLANT
NEEDLE HYPO 22GX1.5 SAFETY (NEEDLE) ×3 IMPLANT
NEEDLE HYPO 25X5/8 SAFETYGLIDE (NEEDLE) IMPLANT
NS IRRIG 1000ML POUR BTL (IV SOLUTION) ×6 IMPLANT
PACK C SECTION WH (CUSTOM PROCEDURE TRAY) ×3 IMPLANT
PAD OB MATERNITY 4.3X12.25 (PERSONAL CARE ITEMS) ×3 IMPLANT
RTRCTR C-SECT PINK 25CM LRG (MISCELLANEOUS) ×3 IMPLANT
STRIP CLOSURE SKIN 1/2X4 (GAUZE/BANDAGES/DRESSINGS) ×4 IMPLANT
SUT CHROMIC GUT AB #0 18 (SUTURE) IMPLANT
SUT MNCRL AB 3-0 PS2 27 (SUTURE) ×3 IMPLANT
SUT SILK 2 0 FSL 18 (SUTURE) IMPLANT
SUT SILK 2 0 SH (SUTURE) ×3 IMPLANT
SUT VIC AB 0 CTX 36 (SUTURE) ×4
SUT VIC AB 0 CTX36XBRD ANBCTRL (SUTURE) ×2 IMPLANT
SUT VIC AB 1 CT1 36 (SUTURE) ×9 IMPLANT
SYR 20CC LL (SYRINGE) ×3 IMPLANT
TOWEL OR 17X24 6PK STRL BLUE (TOWEL DISPOSABLE) ×3 IMPLANT
TRAY FOLEY CATH 14FR (SET/KITS/TRAYS/PACK) ×3 IMPLANT
WATER STERILE IRR 1000ML POUR (IV SOLUTION) ×3 IMPLANT

## 2013-07-24 NOTE — Op Note (Signed)
Preoperative diagnosis: Intrauterine pregnancy at 39 weeks with previous cesarean section  Post operative diagnosis: Same  Anesthesia: Spinal  Anesthesiologist: Dr. Brayton CavesFreeman Jackson  Procedure: Repeaty low transverse cesarean section  Surgeon: Dr. Dois DavenportSandra Rilya Longo  Assistant: Gerrit HeckJessica Emly CNM  Estimated blood loss: 800 cc  Procedure:  After being informed of the planned procedure and possible complications including bleeding, infection, injury to other organs, informed consent is obtained. The patient is taken to OR #8 and given spinal anesthesia without complication. She is placed in the dorsal decubitus position with the pelvis tilted to the left. She is then prepped and draped in a sterile fashion. A Foley catheter is inserted in her bladder.  After assessing adequate level of anesthesia, we infiltrate the suprapubic area with 20 cc of Marcaine 0.25 and perform a Pfannenstiel incision which is brought down sharply to the fascia. The fascia is entered in a low transverse fashion.Linea alba is dissected.We encounter significant adhesions between the left uterus and the abdominal wall as well as with omentum. We sharply dissect adhesions for safe entry in the peritoneal cavity.. An Alexis retractor is easily positioned.   The myometrium is then entered in a low transverse fashion, 2 cm above the vesico-uterine junction ; first with knife and then extended bluntly. Amniotic fluid is clear. We assist the birth of a female  infant in vertex presentation. Mouth and nose are suctioned. The baby is delivered. The cord is clamped and sectioned. The baby is given to the neonatologist present in the room.  The placenta is allowed to deliver spontaneously. It is complete and the cord has 3 vessels. Uterine revision is negative.  We proceed with closure of the myometrium in 2 layers: First with a running locked suture of 0 Vicryl, then with a Lembert suture of 0 Vicryl imbricating the first one. Hemostasis  is completed with cauterization on peritoneal edges and 1 figure-of-eight stitch of 0-Vicryl midline.  Both paracolic gutters are cleaned. Both tubes and ovaries are assessed and normal except for fine adhesions between the left tube and ovary which we dissect sharply.The pelvis is profusely irrigated with warm saline to confirm a satisfactory hemostasis.  Retractors and sponges are removed. Under fascia hemostasis is completed with cauterization. A sheet of Interceed is placed on the uterine incision.The fascia is then closed with 2 running sutures of 0 Vicryl meeting midline. The wound is irrigated with warm saline and hemostasis is completed with cauterization. The skin is closed with a subcuticular suture of 3-0 Monocryl and Steri-Strips after placement of a #10 JP drain in the incision. The drain is sutured to the skin with a stitch of 0-Silk.  Instrument and sponge count is complete x2. Estimated blood loss is 800 cc.  The procedure is well tolerated by the patient who is taken to recovery room in a well and stable condition.  female baby named Janyth Pupaicholas was born at 10:24 and received an Apgar of 9  at 1 minute and 9 at 5 minutes.    Specimen: Placenta sent to [facility] after cord blood donation.   Crist FatSandra A Sejal Cofield MD 4/14/201511:13 AM

## 2013-07-24 NOTE — Transfer of Care (Signed)
Immediate Anesthesia Transfer of Care Note  Patient: Taylor Campbell  Procedure(s) Performed: Procedure(s): REPEAT CESAREAN SECTION (N/A)  Patient Location: PACU  Anesthesia Type:Spinal  Level of Consciousness: awake, alert  and oriented  Airway & Oxygen Therapy: Patient Spontanous Breathing  Post-op Assessment: Report given to PACU RN and Post -op Vital signs reviewed and stable  Post vital signs: Reviewed and stable  Complications: No apparent anesthesia complications

## 2013-07-24 NOTE — Anesthesia Preprocedure Evaluation (Signed)
Anesthesia Evaluation  Patient identified by MRN, date of birth, ID band Patient awake    Reviewed: Allergy & Precautions, H&P , NPO status , Patient's Chart, lab work & pertinent test results, reviewed documented beta blocker date and time   History of Anesthesia Complications (+) POST - OP SPINAL HEADACHE  Airway       Dental   Pulmonary asthma , Current Smoker,          Cardiovascular negative cardio ROS      Neuro/Psych PSYCHIATRIC DISORDERS (OCD, h/o suicide attempt (1995)) Depression Bipolar Disorder scoliosis    GI/Hepatic negative GI ROS, (+)     substance abuse  marijuana use,   Endo/Other  PCOS  Renal/GU negative Renal ROS  negative genitourinary   Musculoskeletal   Abdominal   Peds  Hematology  (+) Blood dyscrasia (thrombocytopenia), ,   Anesthesia Other Findings   Reproductive/Obstetrics (+) Pregnancy (h/o prior C/S for repeat)                           Anesthesia Physical Anesthesia Plan  ASA: II  Anesthesia Plan: Spinal   Post-op Pain Management:    Induction:   Airway Management Planned:   Additional Equipment:   Intra-op Plan:   Post-operative Plan:   Informed Consent: I have reviewed the patients History and Physical, chart, labs and discussed the procedure including the risks, benefits and alternatives for the proposed anesthesia with the patient or authorized representative who has indicated his/her understanding and acceptance.     Plan Discussed with: Surgeon and CRNA  Anesthesia Plan Comments:         Anesthesia Quick Evaluation

## 2013-07-24 NOTE — Addendum Note (Signed)
Addendum created 07/24/13 1543 by Orlie Pollenebra R Dorian Renfro, CRNA   Modules edited: Notes Section   Notes Section:  File: [id number]

## 2013-07-24 NOTE — Anesthesia Postprocedure Evaluation (Signed)
  Anesthesia Post Note  Patient: Taylor Campbell  Procedure(s) Performed: Procedure(s) (LRB): REPEAT CESAREAN SECTION (N/A)  Anesthesia type: Spinal  Patient location: PACU  Post pain: Pain level controlled  Post assessment: Post-op Vital signs reviewed  Last Vitals:  Filed Vitals:   07/24/13 1215  BP: 105/62  Pulse: 71  Temp:   Resp: 16    Post vital signs: Reviewed  Level of consciousness: awake  Complications: No apparent anesthesia complications
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
## 2013-07-24 NOTE — Anesthesia Procedure Notes (Signed)
Spinal  Patient location during procedure: OR Start time: 07/24/2013 9:38 AM Staffing Anesthesiologist: Brayton CavesJACKSON, Natallia Stellmach Performed by: anesthesiologist  Preanesthetic Checklist Completed: patient identified, site marked, surgical consent, pre-op evaluation, timeout performed, IV checked, risks and benefits discussed and monitors and equipment checked Spinal Block Patient position: sitting Prep: DuraPrep Patient monitoring: heart rate, cardiac monitor, continuous pulse ox and blood pressure Approach: midline Location: L3-4 Injection technique: single-shot Needle Needle type: Sprotte  Needle gauge: 24 G Needle length: 9 cm Assessment Sensory level: T4 Additional Notes Patient identified.  Risk benefits discussed including failed block, incomplete pain control, headache, nerve damage, paralysis, blood pressure changes, nausea, vomiting, reactions to medication both toxic or allergic, and postpartum back pain.  Patient expressed understanding and wished to proceed.  All questions were answered.  Sterile technique used throughout procedure.  CSF was clear.  No parasthesia or other complications.  Please see nursing notes for vital signs.

## 2013-07-24 NOTE — Anesthesia Postprocedure Evaluation (Signed)
  Anesthesia Post-op Note  Patient: Taylor Campbell  Procedure(s) Performed: Procedure(s): REPEAT CESAREAN SECTION (N/A)  Patient Location: PACU and Mother/Baby  Anesthesia Type:Spinal  Level of Consciousness: awake, alert , oriented and patient cooperative  Airway and Oxygen Therapy: Patient Spontanous Breathing  Post-op Pain: mild  Post-op Assessment: Post-op Vital signs reviewed, Patient's Cardiovascular Status Stable, Respiratory Function Stable, NAUSEA AND VOMITING PRESENT, Adequate PO intake, Pain level controlled, No headache, No backache, No residual numbness and No residual motor weakness  Post-op Vital Signs: Reviewed and stable  Last Vitals:  Filed Vitals:   07/24/13 1437  BP: 101/62  Pulse: 62  Temp: 36.3 C  Resp: 16    Complications: No apparent anesthesia complications

## 2013-07-25 ENCOUNTER — Encounter (HOSPITAL_COMMUNITY): Payer: Self-pay | Admitting: Obstetrics and Gynecology

## 2013-07-25 LAB — BIRTH TISSUE RECOVERY COLLECTION (PLACENTA DONATION)

## 2013-07-25 LAB — CBC
HCT: 28.5 % — ABNORMAL LOW (ref 36.0–46.0)
HEMOGLOBIN: 9.7 g/dL — AB (ref 12.0–15.0)
MCH: 30.6 pg (ref 26.0–34.0)
MCHC: 34 g/dL (ref 30.0–36.0)
MCV: 89.9 fL (ref 78.0–100.0)
Platelets: 88 10*3/uL — ABNORMAL LOW (ref 150–400)
RBC: 3.17 MIL/uL — AB (ref 3.87–5.11)
RDW: 12.6 % (ref 11.5–15.5)
WBC: 7 10*3/uL (ref 4.0–10.5)

## 2013-07-25 MED ORDER — ACETAMINOPHEN 325 MG PO TABS: 650.0000 mg | ORAL_TABLET | Freq: Four times a day (QID) | ORAL | Status: DC | PRN

## 2013-07-25 MED ORDER — PNEUMOCOCCAL VAC POLYVALENT 25 MCG/0.5ML IJ INJ
0.5000 mL | INJECTION | INTRAMUSCULAR | Status: AC
  Administered 2013-07-26: 0.5 mL via INTRAMUSCULAR
  Filled 2013-07-25: qty 0.5

## 2013-07-25 NOTE — Progress Notes (Signed)
Subjective: Postpartum Day 1: Elective Repeat Cesarean Delivery Patient up ad lib, reports no syncope or dizziness. Patient denies flatulence and bm. Has not consumed regular diet yet Feeding:  Bottle Contraceptive plan:  Depo  Objective: Vital signs in last 24 hours: Temp:  [97.6 F (36.4 C)-98.2 F (36.8 C)] 98.2 F (36.8 C) (04/15 0920) Pulse Rate:  [56-76] 76 (04/15 0920) Resp:  [18-20] 20 (04/15 0920) BP: (93-106)/(58-64) 98/64 mmHg (04/15 0920) SpO2:  [95 %-100 %] 95 % (04/15 0920)  Physical Exam:  General: alert, cooperative and no distress Lochia: appropriate Uterine Fundus: firm Abdomen: Soft, NT at fundus.  Bowel Sounds Absent Incision: Honeycomb dressing-CDI DVT Evaluation: No evidence of DVT seen on physical exam. Negative Homan's sign. No significant calf/ankle edema. JP drain:   N/A   Recent Labs  07/24/13 0830 07/25/13 0545  HGB 11.1* 9.7*  HCT 32.7* 28.5*  WBC 8.0 7.0  Platelets 88  Assessment S/P Repeat C/S Day 1 Asymptomatic Anemia Thrombocytopenia Stable  Plan: Discontinue Ibuprofen usage Continue Fe supplementation  Repeat CBC in am Requests Depo injection prior to discharge Unsure of circumcision plans Continue current mgmt Anticipate d/c tomorrow  Gerrit HeckJessica Amanada Philbrick, CNM, MSN 07/25/2013, 5:50 PM

## 2013-07-26 ENCOUNTER — Encounter (HOSPITAL_COMMUNITY): Payer: Self-pay

## 2013-07-26 LAB — CBC
HEMATOCRIT: 29.9 % — AB (ref 36.0–46.0)
HEMOGLOBIN: 9.9 g/dL — AB (ref 12.0–15.0)
MCH: 29.8 pg (ref 26.0–34.0)
MCHC: 33.1 g/dL (ref 30.0–36.0)
MCV: 90.1 fL (ref 78.0–100.0)
Platelets: 118 10*3/uL — ABNORMAL LOW (ref 150–400)
RBC: 3.32 MIL/uL — AB (ref 3.87–5.11)
RDW: 12.9 % (ref 11.5–15.5)
WBC: 7.4 10*3/uL (ref 4.0–10.5)

## 2013-07-26 MED ORDER — IBUPROFEN 600 MG PO TABS
600.0000 mg | ORAL_TABLET | Freq: Four times a day (QID) | ORAL | Status: DC | PRN
  Administered 2013-07-26 – 2013-07-27 (×4): 600 mg via ORAL
  Filled 2013-07-26 (×4): qty 1

## 2013-07-26 MED ORDER — MEDROXYPROGESTERONE ACETATE 150 MG/ML IM SUSP
150.0000 mg | Freq: Once | INTRAMUSCULAR | Status: DC
  Filled 2013-07-26: qty 1

## 2013-07-26 NOTE — Progress Notes (Signed)
Subjective: Postpartum Day 2: Cesarean Delivery due to repeat Patient up ad lib, reports no syncope or dizziness.  Reports feeling well emotionally, denies pp depression Had loose BMs through night. Feeding:  Bottle at present, but interested in breastfeeding Contraceptive plan:  Depo on d/c, Nexplanon at 6 weeks SW saw patient yesterday  Objective: Vital signs in last 24 hours: Temp:  [98.1 F (36.7 C)-98.4 F (36.9 C)] 98.1 F (36.7 C) (04/16 0753) Pulse Rate:  [72-76] 74 (04/16 0753) Resp:  [18] 18 (04/16 0753) BP: (103-117)/(62-76) 103/63 mmHg (04/16 0753) SpO2:  [96 %] 96 % (04/16 0753)  Physical Exam:  General: alert Lochia: appropriate Uterine Fundus: firm Incision: Honeycomb dressing CDI DVT Evaluation: No evidence of DVT seen on physical exam. Negative Homan's sign. JP drain:   52 cc last 24 hours  Recent Labs  07/24/13 0830 07/25/13 0545 07/26/13 0724  HGB 11.1* 9.7* 9.9*  HCT 32.7* 28.5* 29.9*  WBC 8.0 7.0 7.4  Platelets 118 at 0724; 88 on 07/25/13; 111 on 07/24/13; 118 on 07/20/13; 180 on 01/17/13.  Baby urine + for marijuana  Assessment/Plan: Status post Cesarean section day 2 Thrombocytopenia--stable + baby urine for marijuana Stable post-op   Continue current care. Decision regarding breast or bottle feeding referred to peds, due to + baby screen for marijuana Depo Provera 150 mg IM tomorrow Assess JP drain tomorrow for removal. Plan d/c tomorrow Plan CBC at 6 weeks due to thrombocytopenia     Taylor BridgemanVicki Trejon Campbell 07/26/2013, 9:30 AM

## 2013-07-26 NOTE — Lactation Note (Signed)
This note was copied from the chart of Taylor Campbell. Lactation Consultation Note Follow [facility] considering nipple shield due to mom not being able to compress breast to latch baby independently.  Mom is holding baby asleep on her lap.  Mom reports not wanting to wake baby now, but will call for assist as needed.  Support offered.   Patient Name: Taylor Staci RighterCandace Rocque ZOXWR'UToday's Date: 07/26/2013     Maternal Data    Feeding Feeding Type: Breast Fed Length of feed: 13 min  LATCH Score/Interventions       Intervention(s): Hand pump              Lactation Tools Discussed/Used     Consult Status      Arvella MerlesJana Lynn Shoptaw 07/26/2013, 9:49 PM
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
## 2013-07-26 NOTE — Lactation Note (Signed)
This note was copied from the chart of Taylor Daziya Anding. Lactation Consultation Note  Patient Name: Taylor Staci RighterCandace Campbell ZOXWR'UToday's Date: 07/26/2013  Initial visit with mom to discuss her desire to attempt to breast feed. Discussed case with Dr. Ave Filterhandler prior to [facility] visit. Mom states that she did not originally intend to BF, but now that her breasts are leaking, she thinks she would like to attempt. Discussed with mom that she would need to pump and dump the breast milk for a month prior to putting baby to the breast due to the baby's positive UDS for cannabis, mom stated that she would prefer not to attempt to BF. Mom states that she has had one cigarette since the baby's birth, but plans not to smoke any longer. Enc mom to speak with her doctor about methods for assistance with quitting smoking. Gave mom written documentation regarding the reasons for giving baby milk that is free from drugs and nicotine. Enc mom to wear a well-fitted bra and use cold cabbage leaves to help prevent her milk from coming in. Enc mom to call with any questions, and showed her where the number for [facility] is located in her brochure. Mom states that she [facility]iates the information and [facility] because she feels comfortable with decision not to breast feed.   Maternal Data    Feeding Feeding Type: Formula Nipple Type: Regular  LATCH Score/Interventions                      Lactation Tools Discussed/Used     Consult Status      Sherlyn HayJennifer D Kemesha Mosey 07/26/2013, 11:34 AM

## 2013-07-27 MED ORDER — OXYCODONE-ACETAMINOPHEN 5-325 MG PO TABS: 1.0000 | ORAL_TABLET | ORAL | Status: DC | PRN

## 2013-07-27 MED ORDER — MEASLES, MUMPS & RUBELLA VAC ~~LOC~~ INJ
0.5000 mL | INJECTION | Freq: Once | SUBCUTANEOUS | Status: DC
  Filled 2013-07-27: qty 0.5

## 2013-07-27 MED ORDER — FERROUS SULFATE 325 (65 FE) MG PO TABS: 325.0000 mg | ORAL_TABLET | Freq: Two times a day (BID) | ORAL | Status: DC

## 2013-07-27 MED ORDER — DOCUSATE SODIUM 100 MG PO CAPS: 100.0000 mg | ORAL_CAPSULE | Freq: Two times a day (BID) | ORAL | Status: DC

## 2013-07-27 MED ORDER — SERTRALINE HCL 50 MG PO TABS: 50.0000 mg | ORAL_TABLET | Freq: Every day | ORAL | Status: DC

## 2013-07-27 NOTE — Lactation Note (Signed)
This note was copied from the chart of Boy Hally Schoenbeck. Lactation Consultation Note  Patient Name: Boy Staci RighterCandace Depuy WUJWJ'XToday's Date: 07/27/2013 Reason for consult: Follow-up assessment Mom has been giving bottles thru the night and was giving baby a bottle when I arrived for this visit. Mom reports baby is not latching well with or without the nipple shield and this is overwhelming for her. She is considering pump and bottle feeding but is not sure, she may continue to formula/bottle feed. Advised Mom if she pumps and bottle feeds she needs to pump every 3 hours for 15 minutes including at night. If she is not committed to consistent pumping schedule advised Mom not to pump and bottle as this will set her up for engorgement/infection. Mom has WIC, offered referral to help her get DEBP, Mom declined. Advised of OP services and support group. Advised Mom to call for questions or concerns.   Maternal Data    Feeding Feeding Type: Formula Nipple Type: Slow - flow  LATCH Score/Interventions                      Lactation Tools Discussed/Used     Consult Status Consult Status: Complete    Alfred LevinsKathy Ann Faria Casella 07/27/2013, 10:19 AM

## 2013-07-27 NOTE — Discharge Summary (Signed)
Obstetric Discharge Summary Reason for Admission: cesarean section Prenatal Procedures: none Intrapartum Procedures: cesarean: low cervical, transverse Postpartum Procedures: none Complications-Operative and Postpartum: none Hemoglobin  Date Value Ref Range Status  07/26/2013 9.9* 12.0 - 15.0 g/dL Final     HCT  Date Value Ref Range Status  07/26/2013 29.9* 36.0 - 46.0 % Final    Physical Exam:  General: alert, cooperative and no distress Lochia: appropriate Uterine Fundus: firm Incision: healing well, no significant drainage, Honeycomb dressing & JP Drain removed DVT Evaluation: No evidence of DVT seen on physical exam. Negative Homan's sign. No significant calf/ankle edema.  Discharge Diagnoses: Term Pregnancy-delivered  Discharge Information: Date: 07/27/2013 Activity: pelvic rest Diet: routine Medications: Colace, Iron and Percocet Condition: stable Instructions: refer to practice specific booklet Discharge to: home Follow-up Information   [facility]. Schedule an appointment as soon as possible for a visit in 5 weeks. (Please call if you have any questions or concerns prior to you next visit. )    Specialty:  Obstetrics and Gynecology   Contact information:   [address] [place] [id number] [phone]      Newborn Data: Live born female: Achille Richicholas Birth Weight: 6 lb 10.7 oz (3025 g) APGAR: 9, 9 No Circumcision Home with mother.  Gerrit HeckJessica Ayva Veilleux , CNM, MSN 07/27/2013, 9:47 AM

## 2013-07-27 NOTE — Discharge Instructions (Signed)
Postpartum Depression and Baby Blues °The postpartum period begins right after the birth of a baby. During this time, there is often a great amount of joy and excitement. It is also a time of considerable changes in the life of the parent(s). Regardless of how many times a mother gives birth, each child brings new challenges and dynamics to the family. It is not unusual to have feelings of excitement accompanied by confusing shifts in moods, emotions, and thoughts. All mothers are at risk of developing postpartum depression or the "baby blues." These mood changes can occur right after giving birth, or they may occur many months after giving birth. The baby blues or postpartum depression can be mild or severe. Additionally, postpartum depression can resolve rather quickly, or it can be a long-term condition. °CAUSES °Elevated hormones and their rapid decline are thought to be a main cause of postpartum depression and the baby blues. There are a number of hormones that radically change during and after pregnancy. Estrogen and progesterone usually decrease immediately after delivering your baby. The level of thyroid hormone and various cortisol steroids also rapidly drop. Other factors that play a major role in these changes include major life events and genetics.  °RISK FACTORS °If you have any of the following risks for the baby blues or postpartum depression, know what symptoms to watch out for during the postpartum period. Risk factors that may increase the likelihood of getting the baby blues or postpartum depression include: °· Having a personal or family history of depression. °· Having depression while being pregnant. °· Having premenstrual or oral contraceptive-associated mood issues. °· Having exceptional life stress. °· Having marital conflict. °· Lacking a social support network. °· Having a baby with special needs. °· Having health problems such as diabetes. °SYMPTOMS °Baby blues symptoms include: °· Brief  fluctuations in mood, such as going from extreme happiness to sadness. °· Decreased concentration. °· Difficulty sleeping. °· Crying spells, tearfulness. °· Irritability. °· Anxiety. °Postpartum depression symptoms typically begin within the first month after giving birth. These symptoms include: °· Difficulty sleeping or excessive sleepiness. °· Marked weight loss. °· Agitation. °· Feelings of worthlessness. °· Lack of interest in activity or food. °Postpartum psychosis is a very concerning condition and can be dangerous. Fortunately, it is rare. Displaying any of the following symptoms is cause for immediate medical attention. Postpartum psychosis symptoms include: °· Hallucinations and delusions. °· Bizarre or disorganized behavior. °· Confusion or disorientation. °DIAGNOSIS  °A diagnosis is made by an evaluation of your symptoms. There are no medical or lab tests that lead to a diagnosis, but there are various questionnaires that a caregiver may use to identify those with the baby blues, postpartum depression, or psychosis. Often times, a screening tool called the Edinburgh Postnatal Depression Scale is used to diagnose depression in the postpartum period.  °TREATMENT °The baby blues usually goes away on its own in 1 to 2 weeks. Social support is often all that is needed. You should be encouraged to get adequate sleep and rest. Occasionally, you may be given medicines to help you sleep.  °Postpartum depression requires treatment as it can last several months or longer if it is not treated. Treatment may include individual or group therapy, medicine, or both to address any social, physiological, and psychological factors that may play a role in the depression. Regular exercise, a healthy diet, rest, and social support may also be strongly recommended.  °Postpartum psychosis is more serious and needs treatment right away. Hospitalization is   often needed. HOME CARE INSTRUCTIONS  Get as much rest as you can. Nap  when the baby sleeps.  Exercise regularly. Some women find yoga and walking to be beneficial.  Eat a balanced and nourishing diet.  Do little things that you enjoy. Have a cup of tea, take a bubble bath, read your favorite magazine, or listen to your favorite music.  Avoid alcohol.  Ask for help with household chores, cooking, grocery shopping, or running errands as needed. Do not try to do everything.  Talk to people close to you about how you are feeling. Get support from your partner, family members, friends, or other new moms.  Try to stay positive in how you think. Think about the things you are grateful for.  Do not spend a lot of time alone.  Only take medicine as directed by your caregiver.  Keep all your postpartum appointments.  Let your caregiver know if you have any concerns. SEEK MEDICAL CARE IF: You are having a reaction or problems with your medicine. SEEK IMMEDIATE MEDICAL CARE IF:  You have suicidal feelings.  You feel you may harm the baby or someone else. Document Released: 01/01/2004 Document Revised: 06/21/2011 Document Reviewed: 01/08/2013 [facility] Patient Information 2014 [place], [place]. Iron-Rich Diet An iron-rich diet contains foods that are good sources of iron. Iron is an important mineral that helps your body produce hemoglobin. Hemoglobin is a protein in red blood cells that carries oxygen to the body's tissues. Sometimes, the iron level in your blood can be low. This may be caused by:  A lack of iron in your diet.  Blood loss.  Times of growth, such as during pregnancy or during a child's growth and development. Low levels of iron can cause a decrease in the number of red blood cells. This can result in iron deficiency anemia. Iron deficiency anemia symptoms include:  Tiredness.  Weakness.  Irritability.  Increased chance of infection. Here are some recommendations for daily iron intake:  Males older than 28 years of age need 8 mg of  iron per day.  Women ages 71 to 74 need 18 mg of iron per day.  Pregnant women need 27 mg of iron per day, and women who are over 94 years of age and breastfeeding need 9 mg of iron per day.  Women over the age of 73 need 8 mg of iron per day. SOURCES OF IRON There are 2 types of iron that are found in food: heme iron and nonheme iron. Heme iron is absorbed by the body better than nonheme iron. Heme iron is found in meat, poultry, and fish. Nonheme iron is found in grains, beans, and vegetables. Heme Iron Sources Food / Iron (mg)  Chicken liver, 3 oz (85 g)/ 10 mg  Beef liver, 3 oz (85 g)/ 5.5 mg  Oysters, 3 oz (85 g)/ 8 mg  Beef, 3 oz (85 g)/ 2 to 3 mg  Shrimp, 3 oz (85 g)/ 2.8 mg  [place], 3 oz (85 g)/ 2 mg  Chicken, 3 oz (85 g) / 1 mg  Fish (tuna, halibut), 3 oz (85 g)/ 1 mg  Pork, 3 oz (85 g)/ 0.9 mg Nonheme Iron Sources Food / Iron (mg)  Ready-to-eat breakfast cereal, iron-fortified / 3.9 to 7 mg  Tofu,  cup / 3.4 mg  Kidney beans,  cup / 2.6 mg  Baked potato with skin / 2.7 mg  Asparagus,  cup / 2.2 mg  Avocado / 2 mg  Dried peaches,  cup / 1.6 mg  Raisins,  cup / 1.5 mg  Soy milk, 1 cup / 1.5 mg  Whole-wheat bread, 1 slice / 1.2 mg  Spinach, 1 cup / 0.8 mg  Broccoli,  cup / 0.6 mg IRON ABSORPTION Certain foods can decrease the body's absorption of iron. Try to avoid these foods and beverages while eating meals with iron-containing foods:  Coffee.  Tea.  Fiber.  Soy. Foods containing vitamin C can help increase the amount of iron your body absorbs from iron sources, especially from nonheme sources. Eat foods with vitamin C along with iron-containing foods to increase your iron absorption. Foods that are high in vitamin C include many fruits and vegetables. Some good sources are:  Fresh orange juice.  Oranges.  Strawberries.  Mangoes.  Grapefruit.  Red bell peppers.  Green bell peppers.  Broccoli.  Potatoes with  skin.  Tomato juice. Document Released: 11/10/2004 Document Revised: 06/21/2011 Document Reviewed: 09/17/2010 [facility] Patient Information 2014 [place], [place]. Postpartum Care After Cesarean Delivery After you deliver your newborn (postpartum period), the usual stay in the hospital is 24 72 hours. If there were problems with your labor or delivery, or if you have other medical problems, you might be in the hospital longer.  While you are in the hospital, you will receive help and instructions on how to care for yourself and your newborn during the postpartum period.  While you are in the hospital:  It is normal for you to have pain or discomfort from the incision in your abdomen. Be sure to tell your nurses when you are having pain, where the pain is located, and what makes the pain worse.  If you are breastfeeding, you may feel uncomfortable contractions of your uterus for a couple of weeks. This is normal. The contractions help your uterus get back to normal size.  It is normal to have some bleeding after delivery.  For the first 1 3 days after delivery, the flow is red and the amount may be similar to a period.  It is common for the flow to start and stop.  In the first few days, you may pass some small clots. Let your nurses know if you begin to pass large clots or your flow increases.  Do not  flush blood clots down the toilet before having the nurse look at them.  During the next 3 10 days after delivery, your flow should become more watery and pink or brown-tinged in color.  Ten to fourteen days after delivery, your flow should be a small amount of yellowish-white discharge.  The amount of your flow will decrease over the first few weeks after delivery. Your flow may stop in 6 8 weeks. Most women have had their flow stop by 12 weeks after delivery.  You should change your sanitary pads frequently.  Wash your hands thoroughly with soap and water for at least 20 seconds after  changing pads, using the toilet, or before holding or feeding your newborn.  Your intravenous (IV) tubing will be removed when you are drinking enough fluids.  The urine drainage tube (urinary catheter) that was inserted before delivery may be removed within 6 8 hours after delivery or when feeling returns to your legs. You should feel like you need to empty your bladder within the first 6 8 hours after the catheter has been removed.  In case you become weak, lightheaded, or faint, call your nurse before you get out of bed for the first time and  before you take a shower for the first time.  Within the first few days after delivery, your breasts may begin to feel tender and full. This is called engorgement. Breast tenderness usually goes away within 48 72 hours after engorgement occurs. You may also notice milk leaking from your breasts. If you are not breastfeeding, do not stimulate your breasts. Breast stimulation can make your breasts produce more milk.  Spending as much time as possible with your newborn is very important. During this time, you and your newborn can feel close and get to know each other. Having your newborn stay in your room (rooming in) will help to strengthen the bond with your newborn. It will give you time to get to know your newborn and become comfortable caring for your newborn.  Your hormones change after delivery. Sometimes the hormone changes can temporarily cause you to feel sad or tearful. These feelings should not last more than a few days. If these feelings last longer than that, you should talk to your caregiver.  If desired, talk to your caregiver about methods of family planning or contraception.  Talk to your caregiver about immunizations. Your caregiver may want you to have the following immunizations before leaving the hospital:  Tetanus, diphtheria, and pertussis (Tdap) or tetanus and diphtheria (Td) immunization. It is very important that you and your family  (including grandparents) or others caring for your newborn are up-to-date with the Tdap or Td immunizations. The Tdap or Td immunization can help protect your newborn from getting ill.  Rubella immunization.  Varicella (chickenpox) immunization.  Influenza immunization. You should receive this annual immunization if you did not receive the immunization during your pregnancy. Document Released: 12/22/2011 Document Reviewed: 12/22/2011 [facility] Patient Information 2014 [place]. Etonogestrel implant What is this medicine? ETONOGESTREL (et oh noe JES trel) is a contraceptive (birth control) device. It is used to prevent pregnancy. It can be used for up to 3 years. This medicine may be used for other purposes; ask your health care provider or pharmacist if you have questions. COMMON BRAND NAME(S): Implanon, Nexplanon  What should I tell my health care provider before I take this medicine? They need to know if you have any of these conditions: -abnormal vaginal bleeding -blood vessel disease or blood clots -cancer of the breast, cervix, or liver -depression -diabetes -gallbladder disease -headaches -heart disease or recent heart attack -high blood pressure -high cholesterol -kidney disease -liver disease -renal disease -seizures -tobacco smoker -an unusual or allergic reaction to etonogestrel, other hormones, anesthetics or antiseptics, medicines, foods, dyes, or preservatives -pregnant or trying to get pregnant -breast-feeding How should I use this medicine? This device is inserted just under the skin on the inner side of your upper arm by a health care professional. Talk to your pediatrician regarding the use of this medicine in children. Special care may be needed. Overdosage: If you think you've taken too much of this medicine contact a poison control center or emergency room at once. Overdosage: If you think you have taken too much of this medicine contact a poison  control center or emergency room at once. NOTE: This medicine is only for you. Do not share this medicine with others. What if I miss a dose? This does not apply. What may interact with this medicine? Do not take this medicine with any of the following medications: -amprenavir -bosentan -fosamprenavir This medicine may also interact with the following medications: -barbiturate medicines for inducing sleep or treating seizures -certain medicines for  fungal infections like ketoconazole and itraconazole -griseofulvin -medicines to treat seizures like carbamazepine, felbamate, oxcarbazepine, phenytoin, topiramate -modafinil -phenylbutazone -rifampin -some medicines to treat HIV infection like atazanavir, indinavir, lopinavir, nelfinavir, tipranavir, ritonavir -St. John's wort This list may not describe all possible interactions. Give your health care provider a list of all the medicines, herbs, non-prescription drugs, or dietary supplements you use. Also tell them if you smoke, drink alcohol, or use illegal drugs. Some items may interact with your medicine. What should I watch for while using this medicine? This product does not protect you against HIV infection (AIDS) or other sexually transmitted diseases. You should be able to feel the implant by pressing your fingertips over the skin where it was inserted. Tell your doctor if you cannot feel the implant. What side effects may I notice from receiving this medicine? Side effects that you should report to your doctor or health care professional as soon as possible: -allergic reactions like skin rash, itching or hives, swelling of the face, lips, or tongue -breast lumps -changes in vision -confusion, trouble speaking or understanding -dark urine -depressed mood -general ill feeling or flu-like symptoms -light-colored stools -loss of appetite, nausea -right upper belly pain -severe headaches -severe pain, swelling, or tenderness in the  abdomen -shortness of breath, chest pain, swelling in a leg -signs of pregnancy -sudden numbness or weakness of the face, arm or leg -trouble walking, dizziness, loss of balance or coordination -unusual vaginal bleeding, discharge -unusually weak or tired -yellowing of the eyes or skin Side effects that usually do not require medical attention (Report these to your doctor or health care professional if they continue or are bothersome.): -acne -breast pain -changes in weight -cough -fever or chills -headache -irregular menstrual bleeding -itching, burning, and vaginal discharge -pain or difficulty passing urine -sore throat This list may not describe all possible side effects. Call your doctor for medical advice about side effects. You may report side effects to FDA at 1-800-FDA-1088. Where should I keep my medicine? This drug is given in a hospital or clinic and will not be stored at home. NOTE: This sheet is a summary. It may not cover all possible information. If you have questions about this medicine, talk to your doctor, pharmacist, or health care provider.  2014, [employer]. (2011-10-04 15:37:45)

## 2014-01-07 ENCOUNTER — Encounter (HOSPITAL_COMMUNITY): Payer: Self-pay | Admitting: Emergency Medicine

## 2014-01-07 ENCOUNTER — Emergency Department (HOSPITAL_COMMUNITY)
Admission: EM | Admit: 2014-01-07 | Discharge: 2014-01-07 | Disposition: A | Discharge status: Home / Self Care | Payer: Medicaid Other | Attending: Emergency Medicine | Admitting: Emergency Medicine

## 2014-01-07 DIAGNOSIS — M79609 Pain in unspecified limb: Secondary | ICD-10-CM | POA: Insufficient documentation

## 2014-01-07 DIAGNOSIS — Z8742 Personal history of other diseases of the female genital tract: Secondary | ICD-10-CM | POA: Diagnosis not present

## 2014-01-07 DIAGNOSIS — M79641 Pain in right hand: Secondary | ICD-10-CM

## 2014-01-07 DIAGNOSIS — M542 Cervicalgia: Secondary | ICD-10-CM | POA: Diagnosis not present

## 2014-01-07 DIAGNOSIS — Z8614 Personal history of Methicillin resistant Staphylococcus aureus infection: Secondary | ICD-10-CM | POA: Diagnosis not present

## 2014-01-07 DIAGNOSIS — R55 Syncope and collapse: Secondary | ICD-10-CM | POA: Insufficient documentation

## 2014-01-07 DIAGNOSIS — M79642 Pain in left hand: Secondary | ICD-10-CM

## 2014-01-07 DIAGNOSIS — Z8619 Personal history of other infectious and parasitic diseases: Secondary | ICD-10-CM | POA: Diagnosis not present

## 2014-01-07 DIAGNOSIS — J45909 Unspecified asthma, uncomplicated: Secondary | ICD-10-CM | POA: Insufficient documentation

## 2014-01-07 DIAGNOSIS — F17200 Nicotine dependence, unspecified, uncomplicated: Secondary | ICD-10-CM | POA: Diagnosis not present

## 2014-01-07 MED ORDER — IBUPROFEN 800 MG PO TABS: 800.0000 mg | ORAL_TABLET | Freq: Three times a day (TID) | ORAL | Status: DC

## 2014-01-07 MED ORDER — HYDROCODONE-ACETAMINOPHEN 5-325 MG PO TABS: 2.0000 | ORAL_TABLET | ORAL | Status: DC | PRN

## 2014-01-07 NOTE — ED Notes (Signed)
Bilateral hand numbness x a couple of weeks  Pain in palm of left hand

## 2014-01-07 NOTE — Discharge Instructions (Signed)
Carpal Tunnel Release Carpal tunnel release is done to relieve the pressure on the nerves and tendons on the bottom side of your wrist.  LET YOUR CAREGIVER KNOW ABOUT:   Allergies to food or medicine.  Medicines taken, including vitamins, herbs, eyedrops, over-the-counter medicines, and creams.  Use of steroids (by mouth or creams).  Previous problems with anesthetics or numbing medicines.  History of bleeding problems or blood clots.  Previous surgery.  Other health problems, including diabetes and kidney problems.  Possibility of pregnancy, if this applies. RISKS AND COMPLICATIONS  Some problems that may happen after this procedure include:  Infection.  Damage to the nerves, arteries or tendons could occur. This would be very uncommon.  Bleeding. BEFORE THE PROCEDURE   This surgery may be done while you are asleep (general anesthetic) or may be done under a block where only your forearm and the surgical area is numb.  If the surgery is done under a block, the numbness will gradually wear off within several hours after surgery. HOME CARE INSTRUCTIONS   Have a responsible person with you for 24 hours.  Do not drive a car or use public transportation for 24 hours.  Only take over-the-counter or prescription medicines for pain, discomfort, or fever as directed by your caregiver. Take them as directed.  You may put ice on the palm side of the affected wrist.  Put ice in a plastic bag.  Place a towel between your skin and the bag.  Leave the ice on for 20 to 30 minutes, 4 times per day.  If you were given a splint to keep your wrist from bending, use it as directed. It is important to wear the splint at night or as directed. Use the splint for as long as you have pain or numbness in your hand, arm, or wrist. This may take 1 to 2 months.  Keep your hand raised (elevated) above the level of your heart as much as possible. This keeps swelling down and helps with  discomfort.  Change bandages (dressings) as directed.  Keep the wound clean and dry. SEEK MEDICAL CARE IF:   You develop pain not relieved with medications.  You develop numbness of your hand.  You develop bleeding from your surgical site.  You have an oral temperature above 102 F (38.9 C).  You develop redness or swelling of the surgical site.  You develop new, unexplained problems. SEEK IMMEDIATE MEDICAL CARE IF:   You develop a rash.  You have difficulty breathing.  You develop any reaction or side effects to medications given. Document Released: 06/19/2003 Document Revised: 06/21/2011 Document Reviewed: 02/02/2007 [employer] Patient Information 2015 [employer]. This information is not intended to replace advice given to you by your health care provider. Make sure you discuss any questions you have with your health care provider.  

## 2014-01-07 NOTE — ED Notes (Signed)
Declined W/C at D/C and was escorted to lobby by RN. 

## 2014-01-07 NOTE — ED Provider Notes (Signed)
Medical screening examination/treatment/procedure(s) were performed by non-physician practitioner and as supervising physician I was immediately available for consultation/collaboration.   EKG Interpretation None        Elwin Mocha, MD 01/07/14 1627

## 2014-01-07 NOTE — ED Provider Notes (Signed)
CSN: [id number]     Arrival date & time 01/07/14  1301 History  This chart was scribed for Taylor Masker, PA-C, working with No att. providers found by Jolene Provost, ED Scribe. This patient was seen in room TR07C/TR07C and the patient's care was started at 3:23 PM.    Chief Complaint  Patient presents with  . Hand Pain   The history is provided by the patient. No language interpreter was used.   HPI Comments: Taylor Campbell is a 28 y.o. female who presents to the Emergency Department complaining of worsening bilateral hand pain and numbness that has been going on for the last month. Pt states that the pain and numbness travels down to her feet. Lifting her son aggravates the pain. Pt endorses neck pain for five the last five months since she had her son. She is not allergic to any medication.   Past Medical History  Diagnosis Date  . Asthma   . MRSA (methicillin resistant staph aureus) culture positive   . PCOS (polycystic ovarian syndrome)   . Infection     UTI  . Depression     situation has improved  . Abnormal Pap smear     cancer age 62  . Substance abuse     marijuana  . Shingles 1999  . HPV (human papilloma virus) infection   . Complication of anesthesia   . Spinal headache    Past Surgical History  Procedure Laterality Date  . Cesarean section    . External ear surgery    . Dilation and curettage of uterus    . Incision and drainage of wound  2010    right groin/upper thigh-mrsa  . Cesarean section N/A 07/24/2013    Procedure: REPEAT CESAREAN SECTION;  Surgeon: Esmeralda Arthur, MD;  Location: [facility];  Service: Obstetrics;  Laterality: N/A;   Family History  Problem Relation Age of Onset  . Diabetes Mother   . Hypertension Mother   . COPD Mother   . Heart disease Maternal Grandmother   . Hypertension Maternal Grandmother   . Diabetes Maternal Grandmother   . Kidney disease Maternal Grandfather     failure  . Heart disease Maternal Grandfather   . Hypertension  Maternal Grandfather    History  Substance Use Topics  . Smoking status: Current Every Day Smoker -- 0.25 packs/day for 13 years    Types: Cigarettes  . Smokeless tobacco: Current User     Comment: cutting down; e sig  . Alcohol Use: No   OB History   Grav Para Term Preterm Abortions TAB SAB Ect Mult Living   Review of Systems  Musculoskeletal: Positive for arthralgias and neck pain.  Neurological: Positive for numbness.  All other systems reviewed and are negative.     Allergies  Review of patient's allergies indicates no known allergies.  Home Medications   Prior to Admission medications   Medication Sig Start Date End Date Taking? Authorizing Provider  etonogestrel (IMPLANON) 68 MG IMPL implant Inject 1 each into the skin once.   Yes Historical Provider, MD   BP 105/75  Pulse 76  Temp(Src) 98.1 F (36.7 C) (Oral)  Resp 16  SpO2 99% Physical Exam  Nursing note and vitals reviewed. Constitutional: She is oriented to person, place, and time. She appears well-developed and well-nourished.  HENT:  Head: Normocephalic and atraumatic.  Eyes: Conjunctivae and EOM are normal. Pupils  are equal, round, and reactive to light.  Neck: Normal range of motion. Neck supple.  Cardiovascular: Normal rate, regular rhythm and normal heart sounds.   Pulmonary/Chest: Effort normal and breath sounds normal.  Abdominal: Soft. Bowel sounds are normal.  Musculoskeletal: Normal range of motion.  Positive Phalen's test, and Tinnels test bilaterally.  Neurological: She is alert and oriented to person, place, and time.  Skin: Skin is warm and dry.  Psychiatric: She has a normal mood and affect. Her behavior is normal.    ED Course  Procedures (including critical care time)  DIAGNOSTIC STUDIES: Oxygen Saturation is 99% on room air, normal by my interpretation.    COORDINATION OF CARE: 3:30 PM Will prescribe ibuprofen and apply splints. Pt agreed to treatment  plan.  Labs Review Labs Reviewed - No data to display  Imaging Review No results found.   EKG Interpretation None      MDM   Final diagnoses:  Bilateral hand pain    Pt placed in wrist splints bilat Hydrocodone Ibuprofen Follow up with Dr. Mina Marble for evaluation  Elson Areas, PA-C 01/07/14 1552

## 2014-01-07 NOTE — ED Notes (Signed)
Pt c/o bilateral hand and forearm numbness x 3 weeks. Pt is 5 months post partum. No known injury.

## 2014-02-11 ENCOUNTER — Encounter (HOSPITAL_COMMUNITY): Payer: Self-pay | Admitting: Emergency Medicine

## 2014-02-21 ENCOUNTER — Emergency Department (HOSPITAL_COMMUNITY)
Admission: EM | Admit: 2014-02-21 | Discharge: 2014-02-21 | Discharge status: Against Medical Advice | Payer: Medicaid Other | Attending: Emergency Medicine | Admitting: Emergency Medicine

## 2014-02-21 ENCOUNTER — Encounter (HOSPITAL_COMMUNITY): Payer: Self-pay | Admitting: *Deleted

## 2014-02-21 DIAGNOSIS — S3992XA Unspecified injury of lower back, initial encounter: Secondary | ICD-10-CM | POA: Insufficient documentation

## 2014-02-21 DIAGNOSIS — Y998 Other external cause status: Secondary | ICD-10-CM | POA: Diagnosis not present

## 2014-02-21 DIAGNOSIS — S199XXA Unspecified injury of neck, initial encounter: Secondary | ICD-10-CM | POA: Insufficient documentation

## 2014-02-21 DIAGNOSIS — Z720 Tobacco use: Secondary | ICD-10-CM | POA: Insufficient documentation

## 2014-02-21 DIAGNOSIS — Y9389 Activity, other specified: Secondary | ICD-10-CM | POA: Insufficient documentation

## 2014-02-21 DIAGNOSIS — J45909 Unspecified asthma, uncomplicated: Secondary | ICD-10-CM | POA: Insufficient documentation

## 2014-02-21 DIAGNOSIS — Y92410 Unspecified street and highway as the place of occurrence of the external cause: Secondary | ICD-10-CM | POA: Insufficient documentation

## 2014-02-21 DIAGNOSIS — S6992XA Unspecified injury of left wrist, hand and finger(s), initial encounter: Secondary | ICD-10-CM | POA: Insufficient documentation
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
## 2014-02-21 NOTE — ED Notes (Signed)
Called to minor care without response from lobby

## 2014-02-21 NOTE — ED Notes (Signed)
Called for second time without response from lobby 

## 2014-02-21 NOTE — ED Notes (Signed)
Called pt in waiting room and area immediately outside; unable to locate pt; pt eloped from waiting room

## 2014-08-29 ENCOUNTER — Emergency Department (HOSPITAL_COMMUNITY)
Admission: EM | Admit: 2014-08-29 | Discharge: 2014-08-29 | Disposition: A | Discharge status: Home / Self Care | Payer: Medicaid Other | Attending: Emergency Medicine | Admitting: Emergency Medicine

## 2014-08-29 ENCOUNTER — Encounter (HOSPITAL_COMMUNITY): Payer: Self-pay | Admitting: Emergency Medicine

## 2014-08-29 DIAGNOSIS — Z8619 Personal history of other infectious and parasitic diseases: Secondary | ICD-10-CM | POA: Insufficient documentation

## 2014-08-29 DIAGNOSIS — Z720 Tobacco use: Secondary | ICD-10-CM | POA: Diagnosis not present

## 2014-08-29 DIAGNOSIS — Y9389 Activity, other specified: Secondary | ICD-10-CM | POA: Diagnosis not present

## 2014-08-29 DIAGNOSIS — Z8614 Personal history of Methicillin resistant Staphylococcus aureus infection: Secondary | ICD-10-CM | POA: Insufficient documentation

## 2014-08-29 DIAGNOSIS — T23202A Burn of second degree of left hand, unspecified site, initial encounter: Secondary | ICD-10-CM | POA: Diagnosis not present

## 2014-08-29 DIAGNOSIS — T23272A Burn of second degree of left wrist, initial encounter: Secondary | ICD-10-CM | POA: Diagnosis not present

## 2014-08-29 DIAGNOSIS — Z23 Encounter for immunization: Secondary | ICD-10-CM | POA: Insufficient documentation

## 2014-08-29 DIAGNOSIS — Z8659 Personal history of other mental and behavioral disorders: Secondary | ICD-10-CM | POA: Diagnosis not present

## 2014-08-29 DIAGNOSIS — T23002A Burn of unspecified degree of left hand, unspecified site, initial encounter: Secondary | ICD-10-CM | POA: Diagnosis present

## 2014-08-29 DIAGNOSIS — Y9289 Other specified places as the place of occurrence of the external cause: Secondary | ICD-10-CM | POA: Diagnosis not present

## 2014-08-29 DIAGNOSIS — Z87440 Personal history of urinary (tract) infections: Secondary | ICD-10-CM | POA: Insufficient documentation

## 2014-08-29 DIAGNOSIS — Z79899 Other long term (current) drug therapy: Secondary | ICD-10-CM | POA: Insufficient documentation

## 2014-08-29 DIAGNOSIS — T23102A Burn of first degree of left hand, unspecified site, initial encounter: Secondary | ICD-10-CM

## 2014-08-29 DIAGNOSIS — J45909 Unspecified asthma, uncomplicated: Secondary | ICD-10-CM | POA: Diagnosis not present

## 2014-08-29 DIAGNOSIS — Z792 Long term (current) use of antibiotics: Secondary | ICD-10-CM | POA: Insufficient documentation

## 2014-08-29 DIAGNOSIS — X111XXA Contact with running hot water, initial encounter: Secondary | ICD-10-CM | POA: Insufficient documentation

## 2014-08-29 DIAGNOSIS — Y998 Other external cause status: Secondary | ICD-10-CM | POA: Insufficient documentation

## 2014-08-29 MED ORDER — HYDROCODONE-ACETAMINOPHEN 5-325 MG PO TABS: 1.0000 | ORAL_TABLET | Freq: Four times a day (QID) | ORAL | Status: AC | PRN

## 2014-08-29 MED ORDER — TETANUS-DIPHTH-ACELL PERTUSSIS 5-2.5-18.5 LF-MCG/0.5 IM SUSP
0.5000 mL | Freq: Once | INTRAMUSCULAR | Status: AC
  Administered 2014-08-29: 0.5 mL via INTRAMUSCULAR
  Filled 2014-08-29: qty 0.5

## 2014-08-29 MED ORDER — TRIPLE ANTIBIOTIC 5-400-5000 EX OINT: TOPICAL_OINTMENT | Freq: Four times a day (QID) | CUTANEOUS | Status: AC
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
## 2014-08-29 NOTE — Discharge Instructions (Signed)
Burn Care Your skin is a natural barrier to infection. It is the largest organ of your body. Burns damage this natural protection. To help prevent infection, it is very important to follow your caregiver's instructions in the care of your burn. Burns are classified as:  First degree. There is only redness of the skin (erythema). No scarring is expected.  Second degree. There is blistering of the skin. Scarring may occur with deeper burns.  Third degree. All layers of the skin are injured, and scarring is expected. HOME CARE INSTRUCTIONS   Wash your hands well before changing your bandage.  Change your bandage as often as directed by your caregiver.  Remove the old bandage. If the bandage sticks, you may soak it off with cool, clean water.  Cleanse the burn thoroughly but gently with mild soap and water.  Pat the area dry with a clean, dry cloth.  Apply a thin layer of antibacterial cream to the burn.  Apply a clean bandage as instructed by your caregiver.  Keep the bandage as clean and dry as possible.  Elevate the affected area for the first 24 hours, then as instructed by your caregiver.  Only take over-the-counter or prescription medicines for pain, discomfort, or fever as directed by your caregiver. SEEK IMMEDIATE MEDICAL CARE IF:   You develop excessive pain.  You develop redness, tenderness, swelling, or red streaks near the burn.  The burned area develops yellowish-white fluid (pus) or a bad smell.  You have a fever. MAKE SURE YOU:   Understand these instructions.  Will watch your condition.  Will get help right away if you are not doing well or get worse. Document Released: 03/29/2005 Document Revised: 06/21/2011 Document Reviewed: 08/19/2010 Exit[employer] Patient Information 2015 [employer]. This information is not intended to replace advice given to you by your health care provider. Make sure you discuss any questions you have with your health care  provider.  

## 2017-12-10 ENCOUNTER — Encounter (HOSPITAL_COMMUNITY): Payer: Self-pay | Admitting: Emergency Medicine

## 2018-04-11 ENCOUNTER — Encounter (HOSPITAL_COMMUNITY): Payer: Self-pay | Admitting: Emergency Medicine

## 2018-04-11 ENCOUNTER — Other Ambulatory Visit: Payer: Self-pay

## 2018-04-11 ENCOUNTER — Emergency Department (HOSPITAL_COMMUNITY)
Admission: EM | Admit: 2018-04-11 | Discharge: 2018-04-11 | Disposition: A | Discharge status: Home / Self Care | Payer: BLUE CROSS/BLUE SHIELD | Attending: Emergency Medicine | Admitting: Emergency Medicine

## 2018-04-11 DIAGNOSIS — J45909 Unspecified asthma, uncomplicated: Secondary | ICD-10-CM | POA: Diagnosis not present

## 2018-04-11 DIAGNOSIS — E876 Hypokalemia: Secondary | ICD-10-CM | POA: Diagnosis not present

## 2018-04-11 DIAGNOSIS — K644 Residual hemorrhoidal skin tags: Secondary | ICD-10-CM | POA: Insufficient documentation

## 2018-04-11 DIAGNOSIS — K529 Noninfective gastroenteritis and colitis, unspecified: Secondary | ICD-10-CM | POA: Diagnosis not present

## 2018-04-11 DIAGNOSIS — Z79899 Other long term (current) drug therapy: Secondary | ICD-10-CM | POA: Diagnosis not present

## 2018-04-11 DIAGNOSIS — F17210 Nicotine dependence, cigarettes, uncomplicated: Secondary | ICD-10-CM | POA: Insufficient documentation

## 2018-04-11 DIAGNOSIS — R1110 Vomiting, unspecified: Secondary | ICD-10-CM | POA: Diagnosis present

## 2018-04-11 LAB — URINALYSIS, ROUTINE W REFLEX MICROSCOPIC
Bilirubin Urine: NEGATIVE
Glucose, UA: NEGATIVE mg/dL
Ketones, ur: 20 mg/dL — AB
Leukocytes, UA: NEGATIVE
Nitrite: NEGATIVE
PH: 6 (ref 5.0–8.0)
Protein, ur: 30 mg/dL — AB
SPECIFIC GRAVITY, URINE: 1.027 (ref 1.005–1.030)

## 2018-04-11 LAB — COMPREHENSIVE METABOLIC PANEL
ALBUMIN: 4 g/dL (ref 3.5–5.0)
ALT: 11 U/L (ref 0–44)
AST: 15 U/L (ref 15–41)
Alkaline Phosphatase: 46 U/L (ref 38–126)
Anion gap: 10 (ref 5–15)
BUN: 15 mg/dL (ref 6–20)
CALCIUM: 9 mg/dL (ref 8.9–10.3)
CO2: 26 mmol/L (ref 22–32)
CREATININE: 0.63 mg/dL (ref 0.44–1.00)
Chloride: 103 mmol/L (ref 98–111)
GFR calc Af Amer: >60 mL/min (ref >60)
GFR calc non Af Amer: >60 mL/min (ref >60)
GLUCOSE: 96 mg/dL (ref 70–99)
Potassium: 3 mmol/L — ABNORMAL LOW (ref 3.5–5.1)
SODIUM: 139 mmol/L (ref 135–145)
Total Bilirubin: 0.6 mg/dL (ref 0.3–1.2)
Total Protein: 7.2 g/dL (ref 6.5–8.1)

## 2018-04-11 LAB — RAPID URINE DRUG SCREEN, HOSP PERFORMED
Amphetamines: NOT DETECTED
BENZODIAZEPINES: NOT DETECTED
Barbiturates: NOT DETECTED
COCAINE: NOT DETECTED
Opiates: NOT DETECTED
Tetrahydrocannabinol: POSITIVE — AB

## 2018-04-11 LAB — PREGNANCY, URINE: Preg Test, Ur: NEGATIVE

## 2018-04-11 LAB — CBC
HCT: 42.5 % (ref 36.0–46.0)
Hemoglobin: 13.7 g/dL (ref 12.0–15.0)
MCH: 28.7 pg (ref 26.0–34.0)
MCHC: 32.2 g/dL (ref 30.0–36.0)
MCV: 89.1 fL (ref 80.0–100.0)
PLATELETS: 228 10*3/uL (ref 150–400)
RBC: 4.77 MIL/uL (ref 3.87–5.11)
RDW: 13.7 % (ref 11.5–15.5)
WBC: 10.7 10*3/uL — ABNORMAL HIGH (ref 4.0–10.5)
nRBC: 0 % (ref 0.0–0.2)

## 2018-04-11 LAB — POC OCCULT BLOOD, ED: Fecal Occult Bld: POSITIVE — AB

## 2018-04-11 LAB — LIPASE, BLOOD: Lipase: 31 U/L (ref 11–51)

## 2018-04-11 MED ORDER — POTASSIUM CHLORIDE ER 10 MEQ PO TBCR: 20.0000 meq | EXTENDED_RELEASE_TABLET | Freq: Every day | ORAL | 0 refills | Status: AC

## 2018-04-11 MED ORDER — POTASSIUM CHLORIDE CRYS ER 20 MEQ PO TBCR
40.0000 meq | EXTENDED_RELEASE_TABLET | Freq: Once | ORAL | Status: AC
  Administered 2018-04-11: 40 meq via ORAL
  Filled 2018-04-11: qty 2

## 2018-04-11 MED ORDER — ONDANSETRON HCL 4 MG PO TABS: 4.0000 mg | ORAL_TABLET | Freq: Every day | ORAL | 1 refills | Status: AC | PRN

## 2018-04-11 NOTE — ED Provider Notes (Signed)
[facility]-EMERGENCY DEPT Provider Note   CSN: [id number] Arrival date & time: 04/11/18  1316     History   Chief Complaint Chief Complaint  Patient presents with  . Diarrhea  . Rectal Bleeding    HPI Taylor Campbell is a 32 y.o. female.  The history is provided by the patient.  Emesis   This is a new problem. The current episode started 2 days ago. The problem occurs 2 to 4 times per day. The problem has not changed since onset.The emesis has an appearance of stomach contents. There has been no fever. Associated symptoms include diarrhea. Pertinent negatives include no abdominal pain, no arthralgias, no chills, no cough, no fever, no headaches, no myalgias, no sweats and no URI. Associated symptoms comments: Left eye blurry vision that is now resolved..  Diarrhea   This is a new problem. The current episode started 2 days ago. The problem occurs 5 to 10 times per day. The problem has not changed since onset.The stool consistency is described as blood tinged. There has been no fever. The fever has been present for less than 1 day. Associated symptoms include vomiting. Pertinent negatives include no abdominal pain, no chills, no sweats, no headaches, no arthralgias, no myalgias, no URI and no cough. She has tried increased fluid intake and Kaeopectate for the symptoms. The treatment provided no relief. Her past medical history does not include irritable bowel syndrome, inflammatory bowel disease, short gut syndrome, bowel resection, recent abdominal surgery or malabsorption.    Past Medical History:  Diagnosis Date  . Abnormal Pap smear    cancer age 34  . Asthma   . Complication of anesthesia   . Depression    situation has improved  . HPV (human papilloma virus) infection   . Infection    UTI  . MRSA (methicillin resistant staph aureus) culture positive   . PCOS (polycystic ovarian syndrome)   . Shingles 1999  . Spinal headache   . Substance abuse  (HCC)    marijuana    Patient Active Problem List   Diagnosis Date Noted  . Thrombocytopenia, unspecified--platelets 118 on 07/20/13. 07/24/2013  . Bipolar disorder, unspecified, 07/24/2013  . OCD (obsessive compulsive disorder) 07/24/2013  . Use of cannabis--positive UDS 04/2012 07/24/2013  . Hx MRSA infection--2010, negative PCR 07/20/13 07/24/2013  . Hx LEEP (loop electrosurgical excision procedure), cervix, pregnancy--2003 07/24/2013  . Hx of suicide attempt--Xanax OD 1995 07/24/2013  . Postpartum spinal headache after last delivery 07/24/2013  . Depression--chronic and pp 07/24/2013  . Smoker 07/24/2013  . Rubella non-immune status, antepartum 07/24/2013  . Asthma, chronic--no recent issues or meds 07/24/2013  . Idiopathic scoliosis--dx age 24 07/24/2013  . Cesarean delivery delivered 07/24/2013  . Previous cesarean section complicating pregnancy, antepartum condition 01/17/2013  . PCOS (polycystic ovarian syndrome)     Past Surgical History:  Procedure Laterality Date  . CESAREAN SECTION    . CESAREAN SECTION N/A 07/24/2013   Procedure: REPEAT CESAREAN SECTION;  Surgeon: Esmeralda Arthur, MD;  Location: [facility];  Service: Obstetrics;  Laterality: N/A;  . DILATION AND CURETTAGE OF UTERUS    . EXTERNAL EAR SURGERY    . INCISION AND DRAINAGE OF WOUND  2010   right groin/upper thigh-mrsa     OB History    Gravida  4   Para  2   Term  2   Preterm      AB  2   Living  2  SAB  2   TAB      Ectopic      Multiple      Live Births  2            Home Medications    Prior to Admission medications   Medication Sig Start Date End Date Taking? Authorizing Provider  acetaminophen (TYLENOL) 325 MG tablet Take 650 mg by mouth every 6 (six) hours as needed for moderate pain (Hand burn).    [provider]  Benzocaine (SOLARCAINE ALOE VERA EX) Apply 3 sprays topically daily as needed (Hand burn).    [provider]  etonogestrel (IMPLANON) 68 MG  IMPL implant Inject 1 each into the skin once.    [provider]  HYDROcodone-acetaminophen (NORCO/VICODIN) 5-325 MG per tablet Take 1 tablet by mouth every 6 (six) hours as needed. 08/29/14   Arthor CaptainHarris, Abigail, PA-C  ibuprofen (ADVIL,MOTRIN) 800 MG tablet Take 1 tablet (800 mg total) by mouth 3 (three) times daily. Patient not taking: Reported on 08/29/2014 01/07/14   Elson AreasSofia, Leslie K, PA-C  neomycin-bacitracin-polymyxin (NEOSPORIN) [phone] ointment Apply topically 4 (four) times daily. 08/29/14   Arthor CaptainHarris, Abigail, PA-C  ondansetron (ZOFRAN) 4 MG tablet Take 1 tablet (4 mg total) by mouth daily as needed for nausea or vomiting. 04/11/18 04/11/19  Garnette Gunnerhompson, Dmani Mizer B, MD  potassium chloride (K-DUR) 10 MEQ tablet Take 2 tablets (20 mEq total) by mouth daily for 3 days. 04/11/18 04/14/18  Garnette Gunnerhompson, Mithran Strike B, MD    Family History Family History  Problem Relation Age of Onset  . Heart disease Maternal Grandmother   . Hypertension Maternal Grandmother   . Diabetes Maternal Grandmother   . Kidney disease Maternal Grandfather        failure  . Heart disease Maternal Grandfather   . Hypertension Maternal Grandfather   . Diabetes Mother   . Hypertension Mother   . COPD Mother     Social History Social History   Tobacco Use  . Smoking status: Current Every Day Smoker    Packs/day: 0.25    Years: 13.00    Pack years: 3.25    Types: Cigarettes  . Smokeless tobacco: Current User  . Tobacco comment: cutting down; e sig  Substance Use Topics  . Alcohol use: Yes  . Drug use: Yes    Types: Marijuana    Comment: last was end of Aug     Allergies   Patient has no known allergies.   Review of Systems Review of Systems  Constitutional: Negative for chills and fever.  Respiratory: Negative for cough.   Gastrointestinal: Positive for diarrhea and vomiting. Negative for abdominal pain.  Musculoskeletal: Negative for arthralgias and myalgias.  Neurological: Negative for headaches.   LMP  2 weeks ago Endorses abdominal pain. Denies vaginal bleeding, vaginal discharge  Physical Exam Updated Vital Signs BP 128/78 (BP Location: Right Arm)   Pulse (!) 107   Temp 98.4 F (36.9 C) (Oral)   Resp 20   SpO2 100%   Physical Exam Constitutional:      Appearance: Normal appearance.  HENT:     Head: Normocephalic and atraumatic.     Nose: No congestion.     Mouth/Throat:     Mouth: Mucous membranes are moist.     Pharynx: No oropharyngeal exudate or posterior oropharyngeal erythema.  Eyes:     Extraocular Movements: Extraocular movements intact.     Pupils: Pupils are equal, round, and reactive to light.     Visual Fields:  Right eye visual fields normal and left eye visual fields normal.  Cardiovascular:     Rate and Rhythm: Normal rate and regular rhythm.  Pulmonary:     Effort: Pulmonary effort is normal.     Breath sounds: Normal breath sounds.  Abdominal:     General: Abdomen is flat. There is no distension.     Tenderness: There is no abdominal tenderness. There is no rebound.  Genitourinary:    Rectum: External hemorrhoid present.  Musculoskeletal: Normal range of motion.        General: No swelling or tenderness.  Skin:    General: Skin is warm and dry.     Capillary Refill: Capillary refill takes less than 2 seconds.     Coloration: Skin is not jaundiced or pale.  Neurological:     General: No focal deficit present.     Mental Status: She is alert. Mental status is at baseline.     Cranial Nerves: No cranial nerve deficit.  Psychiatric:        Mood and Affect: Mood normal.        Behavior: Behavior normal.      ED Treatments / Results  Labs (all labs ordered are listed, but only abnormal results are displayed) Labs Reviewed  COMPREHENSIVE METABOLIC PANEL - Abnormal; Notable for the following components:      Result Value   Potassium 3.0 (*)    All other components within normal limits  CBC - Abnormal; Notable for the following components:   WBC  10.7 (*)    All other components within normal limits  URINALYSIS, ROUTINE W REFLEX MICROSCOPIC - Abnormal; Notable for the following components:   Color, Urine AMBER (*)    APPearance HAZY (*)    Hgb urine dipstick MODERATE (*)    Ketones, ur 20 (*)    Protein, ur 30 (*)    Bacteria, UA MANY (*)    All other components within normal limits  RAPID URINE DRUG SCREEN, HOSP PERFORMED - Abnormal; Notable for the following components:   Tetrahydrocannabinol POSITIVE (*)    All other components within normal limits  POC OCCULT BLOOD, ED - Abnormal; Notable for the following components:   Fecal Occult Bld POSITIVE (*)    All other components within normal limits  LIPASE, BLOOD  PREGNANCY, URINE  I-STAT BETA HCG BLOOD, ED ([facility], [facility], [facility] ONLY)    EKG None  Radiology No results found.  Procedures Procedures (including critical care time)  Medications Ordered in ED Medications  potassium chloride SA (K-DUR,KLOR-CON) CR tablet 40 mEq (has no administration in time range)     Initial Impression / Assessment and Plan / ED Course  I have reviewed the triage vital signs and the nursing notes.  Pertinent labs & imaging results that were available during my care of the patient were reviewed by me and considered in my medical decision making (see chart for details).     Patient presenting with 2 days emesis, diarrhea with some bright red blood in it.  Patient is otherwise well-appearing with vital signs are stable.  With blood-tinged concern for EHEC versus IBD versus viral gastro + irritated hemorrhoids.  No known history of recent antibiotics, C. difficile expected.  Will order routine labs CBC, CMP, lipase, stool guaiac, hCG.  Will give IV fluids.  Not actively nauseous, so no need for Zofran at the moment.    Interval update Patient well-appearing and labs reassuring, except for mild hypokalemia which was repleted with  K-Dur.  Rectal exam positive for bloody mucousy stool.  Patient most  likely had infectious gastroenteritis with irritation of external hemorrhoid.  Recommend treat patient with supportive care and Zofran. Encourage aggressive p.o. hydration.  Follow-up as needed.  Final Clinical Impressions(s) / ED Diagnoses   Final diagnoses:  Gastroenteritis  Hypokalemia  External hemorrhoid    ED Discharge Orders         Ordered    potassium chloride (K-DUR) 10 MEQ tablet  Daily     04/11/18 1527    ondansetron (ZOFRAN) 4 MG tablet  Daily PRN     04/11/18 1528           Garnette Gunner, MD 04/11/18 1530    Alvira Monday, MD 04/11/18 2008

## 2018-04-11 NOTE — ED Triage Notes (Signed)
Patient reports N/V/D x2 days. Reports diarrhea today with bright red blood in stools. C/o abdominal cramping.

## 2019-06-13 ENCOUNTER — Encounter (HOSPITAL_COMMUNITY): Payer: Self-pay

## 2019-06-13 ENCOUNTER — Ambulatory Visit (HOSPITAL_COMMUNITY)
Admission: EM | Admit: 2019-06-13 | Discharge: 2019-06-13 | Disposition: A | Discharge status: Home / Self Care | Payer: BLUE CROSS/BLUE SHIELD | Attending: Family Medicine | Admitting: Family Medicine

## 2019-06-13 ENCOUNTER — Other Ambulatory Visit: Payer: Self-pay

## 2019-06-13 DIAGNOSIS — M545 Low back pain, unspecified: Secondary | ICD-10-CM

## 2019-06-13 MED ORDER — CYCLOBENZAPRINE HCL 10 MG PO TABS: ORAL_TABLET | ORAL | 0 refills | Status: AC

## 2019-06-13 MED ORDER — IBUPROFEN 800 MG PO TABS: 800.0000 mg | ORAL_TABLET | Freq: Three times a day (TID) | ORAL | 0 refills | Status: AC

## 2019-06-13 NOTE — ED Provider Notes (Signed)
[facility]ENTER   [id number] 06/13/19 Arrival Time: 1036  ASSESSMENT & PLAN:  1. Acute bilateral low back pain without sciatica     Declines imaging of lower back this morning. Able to ambulate here and hemodynamically stable. No gross neurological abnormalities. Discussed.  Begin: Meds ordered this encounter  Medications  . ibuprofen (ADVIL) 800 MG tablet    Sig: Take 1 tablet (800 mg total) by mouth 3 (three) times daily with meals.    Dispense:  21 tablet    Refill:  0  . cyclobenzaprine (FLEXERIL) 10 MG tablet    Sig: Take 1 tablet by mouth 3 times daily as needed for muscle spasm. Warning: May cause drowsiness.    Dispense:  21 tablet    Refill:  0    Medication sedation precautions given. Encourage ROM/movement as tolerated. Work note provided.  Recommend: Follow-up Information    [facility].   Why: If worsening or failing to improve as anticipated. Contact information: [address] [place] [address] [phone]          Reviewed expectations re: course of current medical issues. Questions answered. Outlined signs and symptoms indicating need for more acute intervention. Patient verbalized understanding. After Visit Summary given.   SUBJECTIVE: History from: patient.  Taylor Campbell is a 34 y.o. female who presents with complaint of fairly persistent bilateral lower back discomfort. Onset abrupt. First noted approx 36-48 hours ago. Injury/trama: reports slipping and falling onto her lower back vs ground. History of back problems requiring medical care: none reported. Pain described as aching and without radiation. Aggravating factors: prolonged walking/standing. Alleviating factors: rest. Progressive LE weakness or saddle anesthesia: none. Extremity sensation changes or weakness: none. Ambulatory without difficulty. Normal bowel/bladder habits: yes; without urinary retention. Normal PO intake  without n/v. No associated abdominal pain/n/v. Self treatment: has 400mg  ibuprofen 4x without significant help.  Reports no chronic steroid use, fevers, IV drug use, or recent back surgeries or procedures.  OBJECTIVE:  Vitals:   06/13/19 1058 06/13/19 1100  BP:  (!) 120/98  Pulse:  76  Resp:  18  Temp:  98.1 F (36.7 C)  TempSrc:  Oral  SpO2:  100%  Weight: 70.3 kg     General appearance: alert; no distress HEENT: [place]; AT Neck: supple with FROM; without midline tenderness CV: RRR Lungs: unlabored respirations; symmetrical air entry Abdomen: soft, non-tender; non-distended Back: mild  and poorly localized tenderness to palpation over lower back; FROM at waist; bruising: none; without specific midline tenderness Extremities: without edema; symmetrical without gross deformities; normal ROM of bilateral LE Skin: warm and dry Neurologic: normal gait; normal sensation and strength of bilateral LE Psychological: alert and cooperative; normal mood and affect   No Known Allergies  Past Medical History:  Diagnosis Date  . Abnormal Pap smear    cancer age 34  . Asthma   . Complication of anesthesia   . Depression    situation has improved  . HPV (human papilloma virus) infection   . Infection    UTI  . MRSA (methicillin resistant staph aureus) culture positive   . PCOS (polycystic ovarian syndrome)   . Shingles 1999  . Spinal headache   . Substance abuse (HCC)    marijuana   Social History   Socioeconomic History  . Marital status: Single    Spouse name: Not on file  . Number of children: Not on file  . Years of education: Not  on file  . Highest education level: Not on file  Occupational History  . Not on file  Tobacco Use  . Smoking status: Current Every Day Smoker    Packs/day: 0.25    Years: 13.00    Pack years: 3.25    Types: Cigarettes  . Smokeless tobacco: Current User  . Tobacco comment: cutting down; e sig  Substance and Sexual Activity  . Alcohol use:  Yes  . Drug use: Yes    Types: Marijuana    Comment: last was end of Aug  . Sexual activity: Not Currently    Birth control/protection: None  Other Topics Concern  . Not on file  Social History Narrative  . Not on file   Social Determinants of Health   Financial Resource Strain:   . Difficulty of Paying Living Expenses: Not on file  Food Insecurity:   . Worried About Charity fundraiser in the Last Year: Not on file  . Ran Out of Food in the Last Year: Not on file  Transportation Needs:   . Lack of Transportation (Medical): Not on file  . Lack of Transportation (Non-Medical): Not on file  Physical Activity:   . Days of Exercise per Week: Not on file  . Minutes of Exercise per Session: Not on file  Stress:   . Feeling of Stress : Not on file  Social Connections:   . Frequency of Communication with Friends and Family: Not on file  . Frequency of Social Gatherings with Friends and Family: Not on file  . Attends Religious Services: Not on file  . Active Member of Clubs or Organizations: Not on file  . Attends Archivist Meetings: Not on file  . Marital Status: Not on file  Intimate Partner Violence:   . Fear of Current or Ex-Partner: Not on file  . Emotionally Abused: Not on file  . Physically Abused: Not on file  . Sexually Abused: Not on file   Family History  Problem Relation Age of Onset  . Heart disease Maternal Grandmother   . Hypertension Maternal Grandmother   . Diabetes Maternal Grandmother   . Kidney disease Maternal Grandfather        failure  . Heart disease Maternal Grandfather   . Hypertension Maternal Grandfather   . Diabetes Mother   . Hypertension Mother   . COPD Mother    Past Surgical History:  Procedure Laterality Date  . CESAREAN SECTION    . CESAREAN SECTION N/A 07/24/2013   Procedure: REPEAT CESAREAN SECTION;  Surgeon: Alwyn Pea, MD;  Location: [facility];  Service: Obstetrics;  Laterality: N/A;  . DILATION AND CURETTAGE OF UTERUS     . EXTERNAL EAR SURGERY    . INCISION AND DRAINAGE OF WOUND  2010   right groin/upper Stephenie [place], MD 06/13/19 1119

## 2019-06-13 NOTE — ED Triage Notes (Signed)
Pt state she has fell at home Monday. Pt states she was feeding her chickens and slipped and fell in the mud. Pt states she has back pain. Pt states she needs a work note.

## 2019-09-04 ENCOUNTER — Encounter (HOSPITAL_COMMUNITY): Payer: Self-pay | Admitting: Orthopedic Surgery

## 2019-09-04 ENCOUNTER — Other Ambulatory Visit: Payer: Self-pay

## 2019-09-04 ENCOUNTER — Ambulatory Visit (HOSPITAL_COMMUNITY)
Admission: EM | Admit: 2019-09-04 | Discharge: 2019-09-04 | Disposition: A | Discharge status: Home / Self Care | Payer: BC Managed Care – PPO

## 2019-09-04 DIAGNOSIS — S9002XA Contusion of left ankle, initial encounter: Secondary | ICD-10-CM

## 2019-09-04 DIAGNOSIS — S93402A Sprain of unspecified ligament of left ankle, initial encounter: Secondary | ICD-10-CM

## 2019-09-04 NOTE — ED Provider Notes (Signed)
[facility]    CSN: [id number] Arrival date & time: 09/04/19  1619      History   Chief Complaint Chief Complaint  Patient presents with  . Ankle Pain    HPI Taylor Campbell is a 34 y.o. female.   Patient presents for evaluation of left ankle pain and wound on left ankle.  She reports a week ago she dropped a pallet on the inside of her ankle.  She reports a wound and a bruise there.  Wound has been healing but there is been some persistent swelling.  She also rolled her left ankle this morning when getting out of her car in the gravel.  She reports has been able to walk but has had a shooting pain in her foot when walking.  Denies significant swelling.  She has been able to work but does note she works on her feet and has some swelling in the injured ankle in the evening.  This subsides overnight.  She also reports mild numbness on top of the foot.  Denies numbness or weakness or tingling elsewhere.     Past Medical History:  Diagnosis Date  . Abnormal Pap smear    cancer age 70  . Asthma   . Complication of anesthesia   . Depression    situation has improved  . HPV (human papilloma virus) infection   . Infection    UTI  . MRSA (methicillin resistant staph aureus) culture positive   . PCOS (polycystic ovarian syndrome)   . Shingles 1999  . Spinal headache   . Substance abuse ([place])    marijuana    Patient Active Problem List   Diagnosis Date Noted  . Thrombocytopenia, unspecified--platelets 118 on 07/20/13. 07/24/2013  . Bipolar disorder, unspecified, 07/24/2013  . OCD (obsessive compulsive disorder) 07/24/2013  . Use of cannabis--positive UDS 04/2012 07/24/2013  . Hx MRSA infection--2010, negative PCR 07/20/13 07/24/2013  . Hx LEEP (loop electrosurgical excision procedure), cervix, pregnancy--2003 07/24/2013  . Hx of suicide attempt--Xanax OD 1995 07/24/2013  . Postpartum spinal headache after last delivery 07/24/2013  . Depression--chronic and pp  07/24/2013  . Smoker 07/24/2013  . Rubella non-immune status, antepartum 07/24/2013  . Asthma, chronic--no recent issues or meds 07/24/2013  . Idiopathic scoliosis--dx age 24 07/24/2013  . Cesarean delivery delivered 07/24/2013  . Previous cesarean section complicating pregnancy, antepartum condition 01/17/2013  . PCOS (polycystic ovarian syndrome)     Past Surgical History:  Procedure Laterality Date  . CESAREAN SECTION    . CESAREAN SECTION N/A 07/24/2013   Procedure: REPEAT CESAREAN SECTION;  Surgeon: Alwyn Pea, MD;  Location: [facility];  Service: Obstetrics;  Laterality: N/A;  . DILATION AND CURETTAGE OF UTERUS    . EXTERNAL EAR SURGERY    . INCISION AND DRAINAGE OF WOUND  2010   right groin/upper thigh-mrsa    OB History    Gravida  4   Para  2   Term  2   Preterm      AB  2   Living  2     SAB  2   TAB      Ectopic      Multiple      Live Births  2            Home Medications    Prior to Admission medications   Medication Sig Start Date End Date Taking? Authorizing Provider  acetaminophen (TYLENOL) 325 MG tablet Take 650 mg by mouth every  6 (six) hours as needed for moderate pain (Hand burn).    [provider]  Benzocaine (SOLARCAINE ALOE VERA EX) Apply 3 sprays topically daily as needed (Hand burn).    [provider]  cyclobenzaprine (FLEXERIL) 10 MG tablet Take 1 tablet by mouth 3 times daily as needed for muscle spasm. Warning: May cause drowsiness. 06/13/19   Mardella Layman, MD  etonogestrel (IMPLANON) 68 MG IMPL implant Inject 1 each into the skin once.    [provider]  etonogestrel-ethinyl estradiol (NUVARING) 0.12-0.015 MG/24HR vaginal ring INSERT 1 RING INTO VAGINA, LEAVE IN PLACE FOR 3 WEEKS, REMOVE ON WEEK 4 FOR MENSES. 08/07/19   [provider]  HYDROcodone-acetaminophen (NORCO/VICODIN) 5-325 MG per tablet Take 1 tablet by mouth every 6 (six) hours as needed. Patient not taking: Reported on 06/13/2019  08/29/14   Arthor Captain, PA-C  ibuprofen (ADVIL) 800 MG tablet Take 1 tablet (800 mg total) by mouth 3 (three) times daily with meals. 06/13/19   Mardella Layman, MD  neomycin-bacitracin-polymyxin (NEOSPORIN) [phone] ointment Apply topically 4 (four) times daily. Patient not taking: Reported on 06/13/2019 08/29/14   Arthor Captain, PA-C  potassium chloride (K-DUR) 10 MEQ tablet Take 2 tablets (20 mEq total) by mouth daily for 3 days. Patient not taking: Reported on 06/13/2019 04/11/18 04/14/18  Garnette Gunner, MD    Family History Family History  Problem Relation Age of Onset  . Heart disease Maternal Grandmother   . Hypertension Maternal Grandmother   . Diabetes Maternal Grandmother   . Kidney disease Maternal Grandfather        failure  . Heart disease Maternal Grandfather   . Hypertension Maternal Grandfather   . Diabetes Mother   . Hypertension Mother   . COPD Mother     Social History Social History   Tobacco Use  . Smoking status: Current Every Day Smoker    Packs/day: 0.25    Years: 13.00    Pack years: 3.25    Types: Cigarettes  . Smokeless tobacco: Current User  . Tobacco comment: cutting down; e sig  Substance Use Topics  . Alcohol use: Yes  . Drug use: Yes    Types: Marijuana    Comment: last was end of Aug     Allergies   Patient has no known allergies.   Review of Systems Review of Systems   Physical Exam Triage Vital Signs ED Triage Vitals  Enc Vitals Group     BP 09/04/19 1718 130/88     Pulse Rate 09/04/19 1718 100     Resp 09/04/19 1718 16     Temp 09/04/19 1718 98.2 F (36.8 C)     Temp Source 09/04/19 1718 Oral     SpO2 09/04/19 1718 99 %     Weight --      Height --      Head Circumference --      Peak Flow --      Pain Score 09/04/19 1725 2     Pain Loc --      Pain Edu? --      Excl. in GC? --    No data found.  Updated Vital Signs BP 130/88 (BP Location: Right Arm)   Pulse 100   Temp 98.2 F (36.8 C) (Oral)   Resp 16    LMP 08/23/2019   SpO2 99%   Visual Acuity Right Eye Distance:   Left Eye Distance:   Bilateral Distance:    Right Eye Near:  Left Eye Near:    Bilateral Near:     Physical Exam Vitals and nursing note reviewed.  Constitutional:      General: She is not in acute distress.    Appearance: She is well-developed. She is not ill-appearing.  HENT:     Head: Normocephalic and atraumatic.  Eyes:     Conjunctiva/sclera: Conjunctivae normal.  Cardiovascular:     Rate and Rhythm: Normal rate.  Pulmonary:     Effort: Pulmonary effort is normal. No respiratory distress.  Musculoskeletal:     Cervical back: Neck supple.     Comments: Left lower extremity: There is a well-healing wound with scab just above the medial malleoli on the left ankle.  There are some resolving ecchymosis in this area as well.  Mild tenderness to palpation in this area.  No medial or lateral malleoli  tenderness.  There is some tenderness in the distribution of the ATFL.  However there is no base of the fifth metatarsal tenderness.  Patient is able to bear weight without issue in clinic.  No midfoot tenderness.  There is mild decreased sensation over dorsum of the foot on the medial aspect just distal to the medial malleoli.  Seems consistent with the L4 distribution on the foot.  No other decrease sensation.  Full sensation above the ankle.  Skin:    General: Skin is warm and dry.  Neurological:     Mental Status: She is alert.      UC Treatments / Results  Labs (all labs ordered are listed, but only abnormal results are displayed) Labs Reviewed - No data to display  EKG   Radiology No results found.  Procedures Procedures (including critical care time)  Medications Ordered in UC Medications - No data to display  Initial Impression / Assessment and Plan / UC Course  I have reviewed the triage vital signs and the nursing notes.  Pertinent labs & imaging results that were available during my care of  the patient were reviewed by me and considered in my medical decision making (see chart for details).     #Left ankle sprain #Left ankle contusion Patient is a 34 year old with left ankle sprain and left ankle contusion.  Defer x-rays due to [facility] ankle rules.  Believe inflammation from contusion left ankle is causing mild numbness on the dorsum of her foot.  She is otherwise neurovascularly intact.  Reassured patient and recommended over-the-counter pain medicines, ice and elevation.  Primary care resource given.  Sports medicine follow-up as needed.  Patient verbalized understanding plan. Final Clinical Impressions(s) / UC Diagnoses   Final diagnoses:  Sprain of left ankle, unspecified ligament, initial encounter  Contusion of left ankle, initial encounter     Discharge Instructions     The wound appears to be healing well. I believe your sprained your ankle.  You may take over-the-counter Tylenol and ibuprofen as needed for pain Ice the ankle elevated in the evenings.  Establish care with the family medicine group  You may also follow-up as needed with the sports medicine group if your ankle continues to bother you.    ED Prescriptions    None     PDMP not reviewed this encounter.   Hermelinda Medicus, PA-C 09/04/19 1831

## 2019-09-04 NOTE — Discharge Instructions (Signed)
The wound appears to be healing well. I believe your sprained your ankle.  You may take over-the-counter Tylenol and ibuprofen as needed for pain Ice the ankle elevated in the evenings.  Establish care with the family medicine group  You may also follow-up as needed with the sports medicine group if your ankle continues to bother you.

## 2019-09-04 NOTE — ED Triage Notes (Signed)
Pt hit left ankle with a wood pallet last Saturday. She works on feet for 8 hrs, pt fell on gravel and rolled on it today. Dark abrasion and swollen. Area numb, tingling and throbbing.    Pt using peroxide and Neosporin.

## 2020-07-18 ENCOUNTER — Emergency Department (INDEPENDENT_AMBULATORY_CARE_PROVIDER_SITE_OTHER): Payer: Self-pay

## 2020-07-18 ENCOUNTER — Encounter: Payer: Self-pay | Admitting: Emergency Medicine

## 2020-07-18 ENCOUNTER — Other Ambulatory Visit: Payer: Self-pay

## 2020-07-18 ENCOUNTER — Emergency Department
Admission: EM | Admit: 2020-07-18 | Discharge: 2020-07-18 | Disposition: A | Discharge status: Home / Self Care | Payer: BLUE CROSS/BLUE SHIELD | Source: Home / Self Care

## 2020-07-18 DIAGNOSIS — M546 Pain in thoracic spine: Secondary | ICD-10-CM

## 2020-07-18 DIAGNOSIS — R52 Pain, unspecified: Secondary | ICD-10-CM

## 2020-07-18 LAB — POCT URINALYSIS DIP (MANUAL ENTRY)
Bilirubin, UA: NEGATIVE
Blood, UA: NEGATIVE
Glucose, UA: NEGATIVE mg/dL
Ketones, POC UA: NEGATIVE mg/dL
Leukocytes, UA: NEGATIVE
Nitrite, UA: NEGATIVE
Protein Ur, POC: NEGATIVE mg/dL
Spec Grav, UA: 1.025 (ref 1.010–1.025)
Urobilinogen, UA: 0.2 E.U./dL
pH, UA: 5.5 (ref 5.0–8.0)

## 2020-07-18 NOTE — ED Provider Notes (Signed)
[facility]    CSN: [id number] Arrival date & time: 07/18/20  1208      History   Chief Complaint Chief Complaint  Patient presents with  . Back Pain    HPI Taylor Campbell is a 35 y.o. female.   HPI  Patient presents today with 2 days of upper thoracic right-sided pain which is radiating laterally below her axilla region and endorses some pain in the mid shoulder region.  She denies injury.  Has been taken ibuprofen without relief of pain.  She reports that the pain is sharp and she feels that something is stabbing her laterally on the right side.  She reports the pain is worse with deep breathing.  Patient is currently not taking any oral contraceptives however she is a chronic smoker.  No history of hypercoagulable conditions.  Past Medical History:  Diagnosis Date  . Abnormal Pap smear    cancer age 76  . Asthma   . Complication of anesthesia   . Depression    situation has improved  . HPV (human papilloma virus) infection   . Infection    UTI  . MRSA (methicillin resistant staph aureus) culture positive   . PCOS (polycystic ovarian syndrome)   . Shingles 1999  . Spinal headache   . Substance abuse (HCC)    marijuana    Patient Active Problem List   Diagnosis Date Noted  . Thrombocytopenia, unspecified--platelets 118 on 07/20/13. 07/24/2013  . Bipolar disorder, unspecified, 07/24/2013  . OCD (obsessive compulsive disorder) 07/24/2013  . Use of cannabis--positive UDS 04/2012 07/24/2013  . Hx MRSA infection--2010, negative PCR 07/20/13 07/24/2013  . Hx LEEP (loop electrosurgical excision procedure), cervix, pregnancy--2003 07/24/2013  . Hx of suicide attempt--Xanax OD 1995 07/24/2013  . Postpartum spinal headache after last delivery 07/24/2013  . Depression--chronic and pp 07/24/2013  . Smoker 07/24/2013  . Rubella non-immune status, antepartum 07/24/2013  . Asthma, chronic--no recent issues or meds 07/24/2013  . Idiopathic scoliosis--dx age 1  07/24/2013  . Cesarean delivery delivered 07/24/2013  . Previous cesarean section complicating pregnancy, antepartum condition 01/17/2013  . PCOS (polycystic ovarian syndrome)     Past Surgical History:  Procedure Laterality Date  . CESAREAN SECTION    . CESAREAN SECTION N/A 07/24/2013   Procedure: REPEAT CESAREAN SECTION;  Surgeon: Esmeralda Arthur, MD;  Location: [facility];  Service: Obstetrics;  Laterality: N/A;  . DILATION AND CURETTAGE OF UTERUS    . EXTERNAL EAR SURGERY    . INCISION AND DRAINAGE OF WOUND  2010   right groin/upper thigh-mrsa    OB History    Gravida  4   Para  2   Term  2   Preterm      AB  2   Living  2     SAB  2   IAB      Ectopic      Multiple      Live Births  2            Home Medications    Prior to Admission medications   Medication Sig Start Date End Date Taking? Authorizing Provider  acetaminophen (TYLENOL) 325 MG tablet Take 650 mg by mouth every 6 (six) hours as needed for moderate pain (Hand burn).    [provider]  Benzocaine (SOLARCAINE ALOE VERA EX) Apply 3 sprays topically daily as needed (Hand burn).    [provider]  cyclobenzaprine (FLEXERIL) 10 MG tablet Take 1 tablet by mouth  3 times daily as needed for muscle spasm. Warning: May cause drowsiness. 06/13/19   Mardella Layman, MD  etonogestrel (NEXPLANON) 68 MG IMPL implant Inject 1 each into the skin once.    [provider]  etonogestrel-ethinyl estradiol (NUVARING) 0.12-0.015 MG/24HR vaginal ring INSERT 1 RING INTO VAGINA, LEAVE IN PLACE FOR 3 WEEKS, REMOVE ON WEEK 4 FOR MENSES. 08/07/19   [provider]  HYDROcodone-acetaminophen (NORCO/VICODIN) 5-325 MG per tablet Take 1 tablet by mouth every 6 (six) hours as needed. Patient not taking: No sig reported 08/29/14   Arthor Captain, PA-C  ibuprofen (ADVIL) 800 MG tablet Take 1 tablet (800 mg total) by mouth 3 (three) times daily with meals. 06/13/19   Mardella Layman, MD   neomycin-bacitracin-polymyxin (NEOSPORIN) [id number] ointment Apply topically 4 (four) times daily. Patient not taking: No sig reported 08/29/14   Arthor Captain, PA-C  potassium chloride (K-DUR) 10 MEQ tablet Take 2 tablets (20 mEq total) by mouth daily for 3 days. Patient not taking: No sig reported 04/11/18 04/14/18  Garnette Gunner, MD    Family History Family History  Problem Relation Age of Onset  . Heart disease Maternal Grandmother   . Hypertension Maternal Grandmother   . Diabetes Maternal Grandmother   . Kidney disease Maternal Grandfather        failure  . Heart disease Maternal Grandfather   . Hypertension Maternal Grandfather   . Diabetes Mother   . Hypertension Mother   . COPD Mother     Social History Social History   Tobacco Use  . Smoking status: Current Every Day Smoker    Packs/day: 0.25    Years: 13.00    Pack years: 3.25    Types: Cigarettes  . Smokeless tobacco: Current User  . Tobacco comment: cutting down; e sig  Substance Use Topics  . Alcohol use: Yes  . Drug use: Yes    Types: Marijuana    Comment: last was end of Aug     Allergies   Patient has no known allergies.   Review of Systems Review of Systems Pertinent negatives listed in HPI '  Physical Exam Triage Vital Signs ED Triage Vitals  Enc Vitals Group     BP 07/18/20 1228 112/71     Pulse Rate 07/18/20 1228 88     Resp 07/18/20 1228 16     Temp 07/18/20 1228 97.8 F (36.6 C)     Temp Source 07/18/20 1228 Oral     SpO2 07/18/20 1228 97 %     Weight --      Height --      Head Circumference --      Peak Flow --      Pain Score 07/18/20 1224 9     Pain Loc --      Pain Edu? --      Excl. in GC? --    No data found.  Updated Vital Signs BP 112/71 (BP Location: Left Arm)   Pulse 88   Temp 97.8 F (36.6 C) (Oral)   Resp 16   SpO2 97%   Breastfeeding No   Visual Acuity Right Eye Distance:   Left Eye Distance:   Bilateral Distance:    Right Eye Near:   Left  Eye Near:    Bilateral Near:     Physical Exam Constitutional:      Appearance: Normal appearance. She is well-developed.  Cardiovascular:     Rate and Rhythm: Normal rate and regular rhythm.  Heart sounds: Normal heart sounds.  Pulmonary:       Comments: Generalized diminished lung sounds and dry hacking cough noted intermittently during exam Musculoskeletal:     Right shoulder: No tenderness or bony tenderness. Normal range of motion. Normal strength.     Thoracic back: Normal. No bony tenderness. Normal range of motion.     Comments: No reproducible tenderness with deep palpation and range of motion activities  Neurological:     Mental Status: She is alert.  Psychiatric:        Attention and Perception: Attention normal.        Mood and Affect: Mood normal.        Speech: Speech normal.        Behavior: Behavior is cooperative.      UC Treatments / Results  Labs (all labs ordered are listed, but only abnormal results are displayed) Labs Reviewed  POCT URINALYSIS DIP (MANUAL ENTRY)    EKG   Radiology DG Thoracic Spine 2 View  Result Date: 07/18/2020 CLINICAL DATA:  Multiple days of mid back pain, no known injury. EXAM: THORACIC SPINE 2 VIEWS COMPARISON:  None. FINDINGS: There is no evidence of thoracic spine fracture. Alignment is normal. No other significant bone abnormalities are identified. Right lower lobe calcified granuloma IMPRESSION: No acute osseous abnormality. Electronically Signed   By: Maudry Mayhew MD   On: 07/18/2020 13:32    Procedures Procedures (including critical care time)  Medications Ordered in UC Medications - No data to display  Initial Impression / Assessment and Plan / UC Course  I have reviewed the triage vital signs and the nursing notes.  Pertinent labs & imaging results that were available during my care of the patient were reviewed by me and considered in my medical decision making (see chart for details).    Patient presents  today with some nonspecific upper thoracic lateral chest wall stabbing pain which is exacerbated by deep breathing.  Medical history significant for substance use, chronic smoking, thrombocytopenia, and a history of prior oral contraceptive which she is discontinued. Given palpitations and sharp pain, concern for possible PE risk.  -Patient directed to go to [facility] medical ER for further work-up and evaluation of source of stabbing pain.  Patient verbalized understanding agreement with plan stable for discharge. Final Clinical Impressions(s) / UC Diagnoses   Final diagnoses:  Thoracic back pain  Stabbing pain, lateral thoracic pain     Discharge Instructions     Your x-ray was normal with the exception that does show a right lower chest wall granuloma this is likely related to inflammation of the chest from prolonged smoking. However I do not think this is the source of your chest pain.  Unable to rule out a possible pulmonary embolism or blood clot involving the lung.  Would like for you to go immediately over to [facility] medical center for further work-up and evaluation to rule out a pulmonary embolism given the degree of pain that you are experiencing with deep breathing.  No pneumonia was identified on imaging today.    ED Prescriptions    None     PDMP not reviewed this encounter.   Bing Neighbors, FNP 07/18/20 1410

## 2020-07-18 NOTE — Discharge Instructions (Addendum)
Your x-ray was normal with the exception that does show a right lower chest wall granuloma this is likely related to inflammation of the chest from prolonged smoking. However I do not think this is the source of your chest pain.  Unable to rule out a possible pulmonary embolism or blood clot involving the lung.  Would like for you to go immediately over to [facility] medical center for further work-up and evaluation to rule out a pulmonary embolism given the degree of pain that you are experiencing with deep breathing.  No pneumonia was identified on imaging today.

## 2020-07-18 NOTE — ED Triage Notes (Signed)
Patient presents to Urgent Care with complaints of upper back pain since 2 days. Patient reports worsening back pain today. Sharp stabbing pain. Taking Ibuprofen for pain.

## 2022-07-09 IMAGING — DX DG THORACIC SPINE 2V
3 series · 3 of 3 positions shown · non-contrast
Comparison: None.

CLINICAL DATA: Multiple days of mid back pain, no known injury.

EXAM:
THORACIC SPINE 2 VIEWS

[t-spine ap]
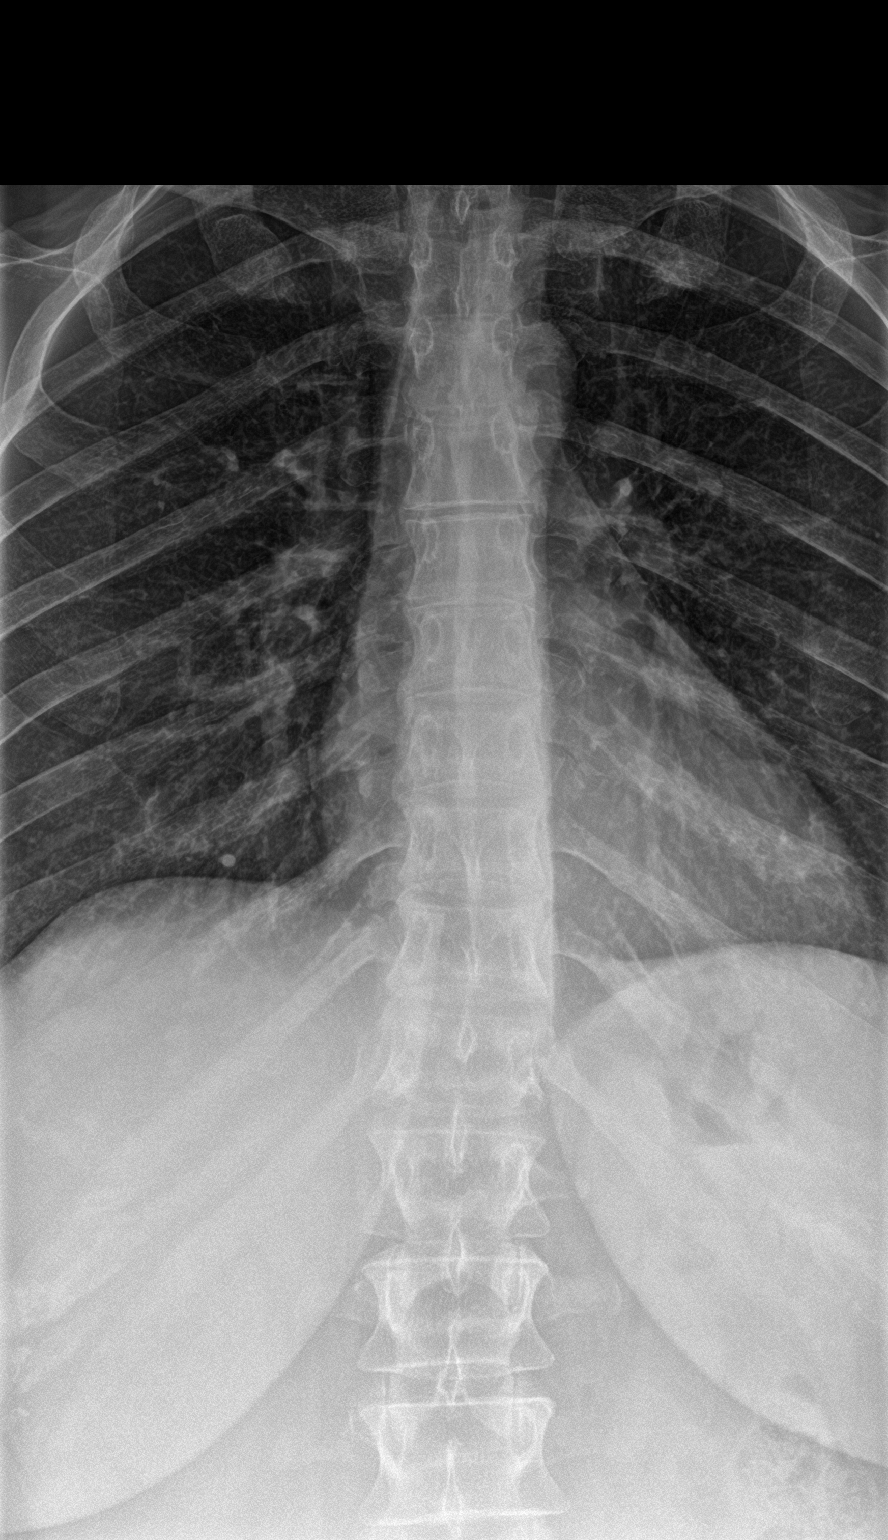

[t-spine lat]
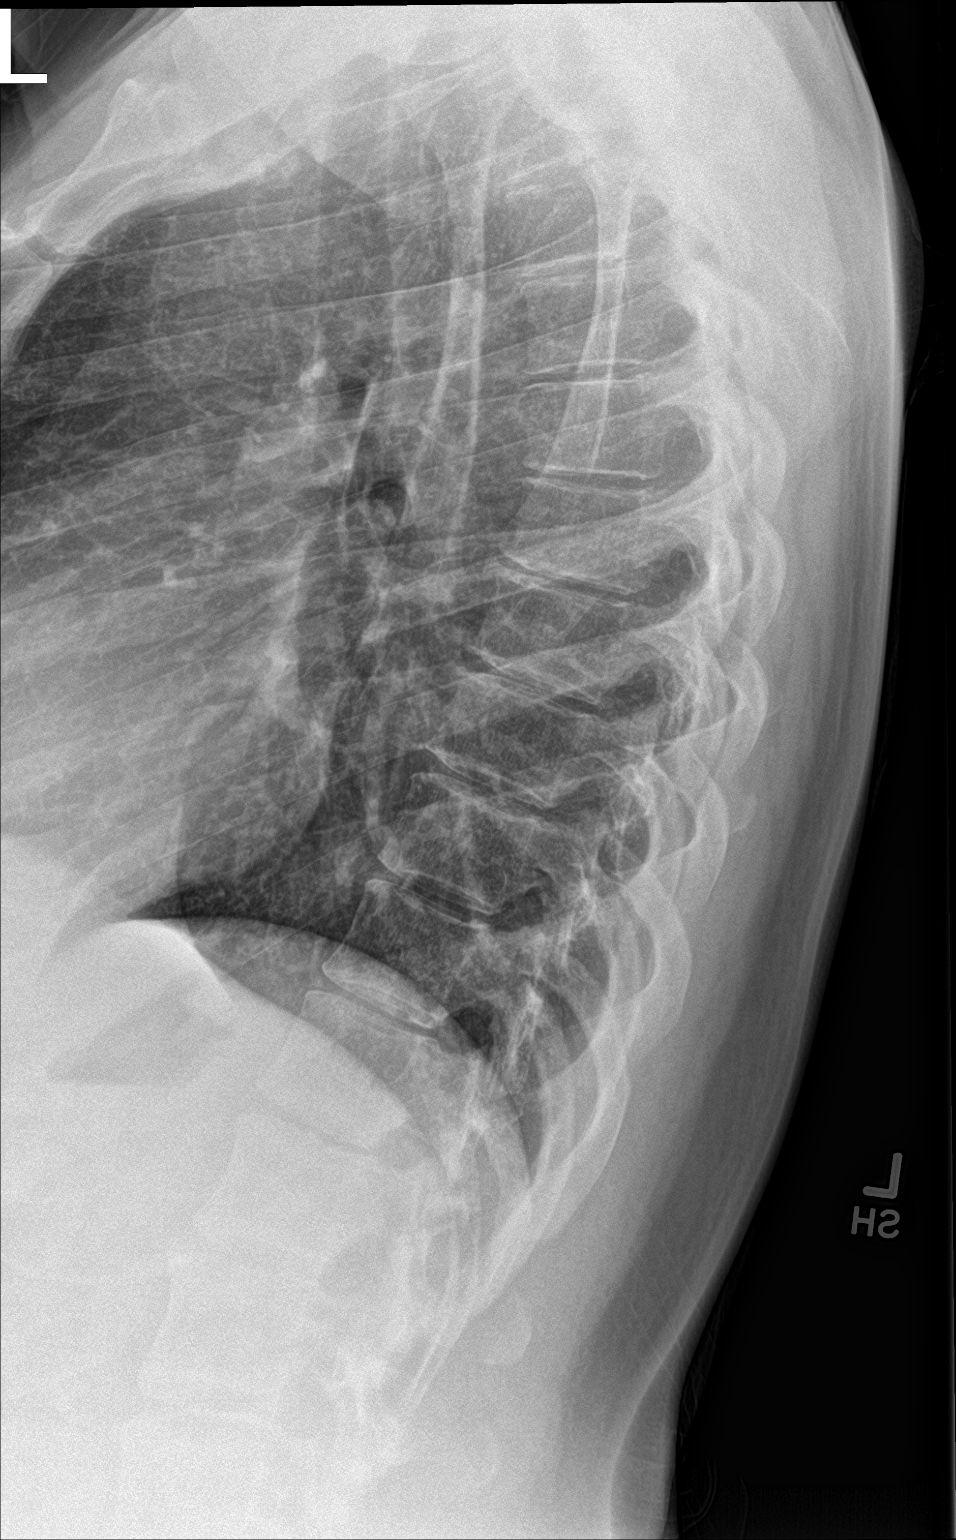

[t-spine swimmers]
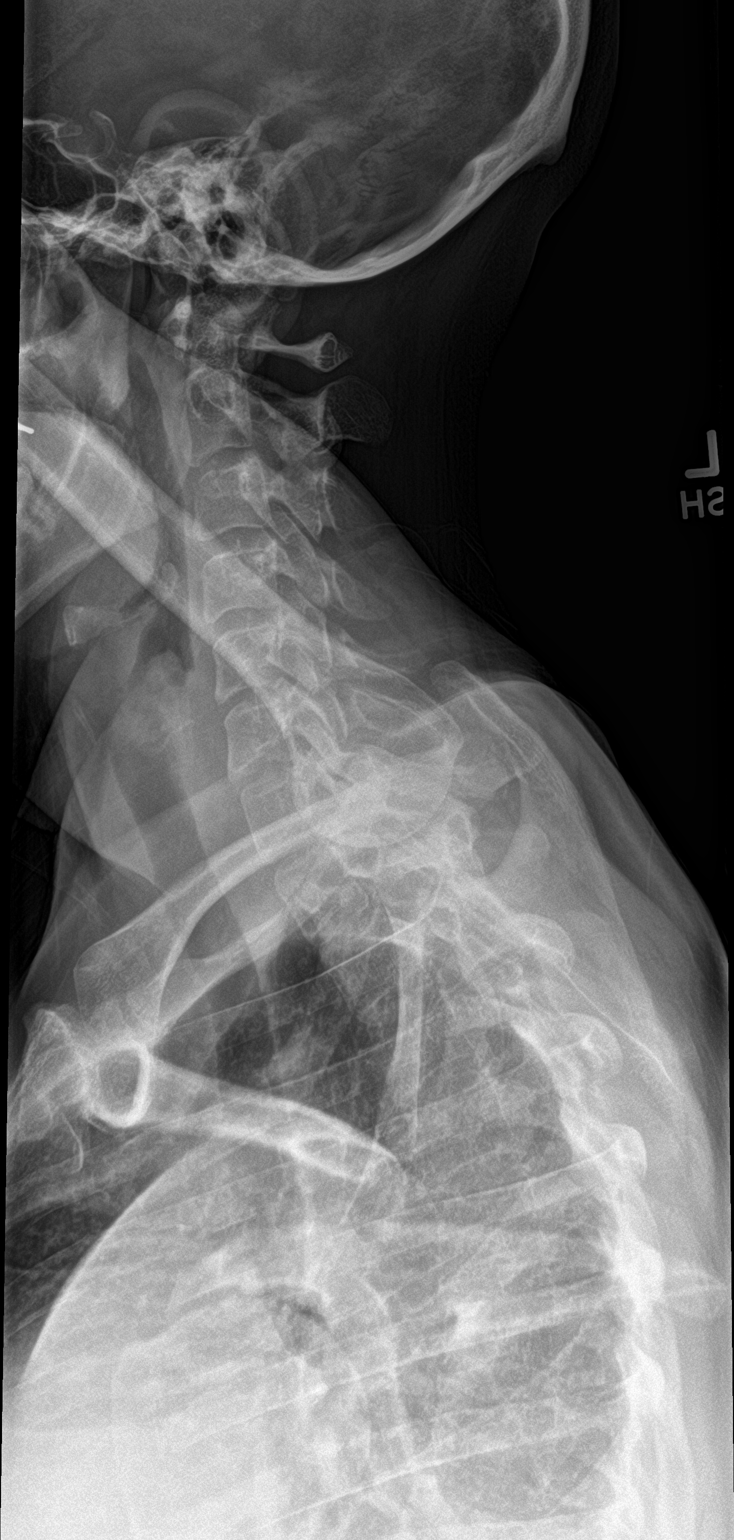

[3 of 3 positions shown; findings below may reference images not displayed]

FINDINGS: There is no evidence of thoracic spine fracture. Alignment is
normal. No other significant bone abnormalities are identified.
Right lower lobe calcified granuloma
IMPRESSION: No acute osseous abnormality.
